# Patient Record
Sex: Female | Born: 2000 | Race: White | Hispanic: No | Marital: Single | Smoking: Never smoker
Health system: Southern US, Community
[De-identification: ages and names within clinical notes are randomized; demographics above are authoritative.]

## PROBLEM LIST (undated history)

## (undated) DIAGNOSIS — G35 Multiple sclerosis: Secondary | ICD-10-CM

## (undated) DIAGNOSIS — J45909 Unspecified asthma, uncomplicated: Secondary | ICD-10-CM

## (undated) HISTORY — DX: Unspecified asthma, uncomplicated: J45.909

## (undated) HISTORY — DX: Multiple sclerosis: G35

---
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
## 2020-07-20 DIAGNOSIS — R300 Dysuria: Secondary | ICD-10-CM | POA: Diagnosis not present

## 2020-12-03 DIAGNOSIS — U071 COVID-19: Secondary | ICD-10-CM | POA: Diagnosis not present

## 2020-12-26 DIAGNOSIS — Z0000 Encounter for general adult medical examination without abnormal findings: Secondary | ICD-10-CM | POA: Diagnosis not present

## 2020-12-26 DIAGNOSIS — E559 Vitamin D deficiency, unspecified: Secondary | ICD-10-CM | POA: Diagnosis not present

## 2021-02-18 DIAGNOSIS — F439 Reaction to severe stress, unspecified: Secondary | ICD-10-CM | POA: Diagnosis not present

## 2021-02-18 DIAGNOSIS — R002 Palpitations: Secondary | ICD-10-CM | POA: Diagnosis not present

## 2021-02-18 DIAGNOSIS — M549 Dorsalgia, unspecified: Secondary | ICD-10-CM | POA: Diagnosis not present

## 2021-02-18 DIAGNOSIS — Z6841 Body Mass Index (BMI) 40.0 and over, adult: Secondary | ICD-10-CM | POA: Diagnosis not present

## 2021-05-20 DIAGNOSIS — Z6841 Body Mass Index (BMI) 40.0 and over, adult: Secondary | ICD-10-CM | POA: Diagnosis not present

## 2021-05-20 DIAGNOSIS — R002 Palpitations: Secondary | ICD-10-CM | POA: Diagnosis not present

## 2021-05-20 DIAGNOSIS — M549 Dorsalgia, unspecified: Secondary | ICD-10-CM | POA: Diagnosis not present

## 2021-05-20 DIAGNOSIS — F439 Reaction to severe stress, unspecified: Secondary | ICD-10-CM | POA: Diagnosis not present

## 2021-08-20 DIAGNOSIS — R200 Anesthesia of skin: Secondary | ICD-10-CM | POA: Diagnosis not present

## 2021-08-20 DIAGNOSIS — Z6841 Body Mass Index (BMI) 40.0 and over, adult: Secondary | ICD-10-CM | POA: Diagnosis not present

## 2021-08-20 DIAGNOSIS — F439 Reaction to severe stress, unspecified: Secondary | ICD-10-CM | POA: Diagnosis not present

## 2021-10-07 DIAGNOSIS — M549 Dorsalgia, unspecified: Secondary | ICD-10-CM | POA: Diagnosis not present

## 2021-10-07 DIAGNOSIS — R202 Paresthesia of skin: Secondary | ICD-10-CM | POA: Diagnosis not present

## 2021-10-07 DIAGNOSIS — Z6837 Body mass index (BMI) 37.0-37.9, adult: Secondary | ICD-10-CM | POA: Diagnosis not present

## 2021-10-07 DIAGNOSIS — R200 Anesthesia of skin: Secondary | ICD-10-CM | POA: Diagnosis not present

## 2021-10-09 ENCOUNTER — Emergency Department (HOSPITAL_COMMUNITY): Payer: BC Managed Care – PPO

## 2021-10-09 ENCOUNTER — Encounter (HOSPITAL_COMMUNITY): Payer: Self-pay

## 2021-10-09 ENCOUNTER — Inpatient Hospital Stay (HOSPITAL_COMMUNITY)
Admission: EM | Admit: 2021-10-09 | Discharge: 2021-10-14 | DRG: 059 | Disposition: A | Discharge status: Home / Self Care | Payer: BC Managed Care – PPO | Attending: Infectious Diseases | Admitting: Infectious Diseases

## 2021-10-09 ENCOUNTER — Other Ambulatory Visit: Payer: Self-pay

## 2021-10-09 DIAGNOSIS — R4182 Altered mental status, unspecified: Secondary | ICD-10-CM | POA: Diagnosis not present

## 2021-10-09 DIAGNOSIS — Z6841 Body Mass Index (BMI) 40.0 and over, adult: Secondary | ICD-10-CM

## 2021-10-09 DIAGNOSIS — F419 Anxiety disorder, unspecified: Secondary | ICD-10-CM | POA: Diagnosis not present

## 2021-10-09 DIAGNOSIS — G35 Multiple sclerosis: Secondary | ICD-10-CM | POA: Diagnosis not present

## 2021-10-09 DIAGNOSIS — R531 Weakness: Secondary | ICD-10-CM

## 2021-10-09 DIAGNOSIS — R200 Anesthesia of skin: Secondary | ICD-10-CM | POA: Diagnosis not present

## 2021-10-09 DIAGNOSIS — I959 Hypotension, unspecified: Secondary | ICD-10-CM | POA: Diagnosis present

## 2021-10-09 DIAGNOSIS — M50221 Other cervical disc displacement at C4-C5 level: Secondary | ICD-10-CM | POA: Diagnosis not present

## 2021-10-09 DIAGNOSIS — T380X5A Adverse effect of glucocorticoids and synthetic analogues, initial encounter: Secondary | ICD-10-CM | POA: Diagnosis present

## 2021-10-09 DIAGNOSIS — F32A Depression, unspecified: Secondary | ICD-10-CM | POA: Diagnosis present

## 2021-10-09 DIAGNOSIS — R739 Hyperglycemia, unspecified: Secondary | ICD-10-CM | POA: Diagnosis not present

## 2021-10-09 DIAGNOSIS — Z881 Allergy status to other antibiotic agents status: Secondary | ICD-10-CM | POA: Diagnosis not present

## 2021-10-09 DIAGNOSIS — R000 Tachycardia, unspecified: Secondary | ICD-10-CM | POA: Diagnosis not present

## 2021-10-09 DIAGNOSIS — M439 Deforming dorsopathy, unspecified: Secondary | ICD-10-CM | POA: Diagnosis not present

## 2021-10-09 DIAGNOSIS — M545 Low back pain, unspecified: Secondary | ICD-10-CM | POA: Diagnosis not present

## 2021-10-09 DIAGNOSIS — R270 Ataxia, unspecified: Secondary | ICD-10-CM | POA: Diagnosis not present

## 2021-10-09 DIAGNOSIS — R519 Headache, unspecified: Secondary | ICD-10-CM | POA: Diagnosis not present

## 2021-10-09 DIAGNOSIS — Z880 Allergy status to penicillin: Secondary | ICD-10-CM

## 2021-10-09 LAB — RAPID URINE DRUG SCREEN, HOSP PERFORMED
Amphetamines: NOT DETECTED
Barbiturates: NOT DETECTED
Benzodiazepines: NOT DETECTED
Cocaine: NOT DETECTED
Opiates: NOT DETECTED
Tetrahydrocannabinol: NOT DETECTED

## 2021-10-09 LAB — COMPREHENSIVE METABOLIC PANEL
ALT: 13 U/L (ref 0–44)
AST: 15 U/L (ref 15–41)
Albumin: 3.5 g/dL (ref 3.5–5.0)
Alkaline Phosphatase: 99 U/L (ref 38–126)
Anion gap: 8 (ref 5–15)
BUN: 12 mg/dL (ref 6–20)
CO2: 25 mmol/L (ref 22–32)
Calcium: 8.8 mg/dL — ABNORMAL LOW (ref 8.9–10.3)
Chloride: 105 mmol/L (ref 98–111)
Creatinine, Ser: 0.88 mg/dL (ref 0.44–1.00)
GFR, Estimated: >60 mL/min (ref >60)
Glucose, Bld: 94 mg/dL (ref 70–99)
Potassium: 3.9 mmol/L (ref 3.5–5.1)
Sodium: 138 mmol/L (ref 135–145)
Total Bilirubin: 0.5 mg/dL (ref 0.3–1.2)
Total Protein: 7.7 g/dL (ref 6.5–8.1)

## 2021-10-09 LAB — URINALYSIS, ROUTINE W REFLEX MICROSCOPIC
Bilirubin Urine: NEGATIVE
Glucose, UA: NEGATIVE mg/dL
Ketones, ur: NEGATIVE mg/dL
Leukocytes,Ua: NEGATIVE
Nitrite: NEGATIVE
Protein, ur: NEGATIVE mg/dL
Specific Gravity, Urine: 1.019 (ref 1.005–1.030)
pH: 5 (ref 5.0–8.0)

## 2021-10-09 LAB — CBC WITH DIFFERENTIAL/PLATELET
Abs Immature Granulocytes: 0.01 10*3/uL (ref 0.00–0.07)
Basophils Absolute: 0.1 10*3/uL (ref 0.0–0.1)
Basophils Relative: 1 %
Eosinophils Absolute: 0.1 10*3/uL (ref 0.0–0.5)
Eosinophils Relative: 1 %
HCT: 40.6 % (ref 36.0–46.0)
Hemoglobin: 13.1 g/dL (ref 12.0–15.0)
Immature Granulocytes: 0 %
Lymphocytes Relative: 17 %
Lymphs Abs: 1.6 10*3/uL (ref 0.7–4.0)
MCH: 28.5 pg (ref 26.0–34.0)
MCHC: 32.3 g/dL (ref 30.0–36.0)
MCV: 88.5 fL (ref 80.0–100.0)
Monocytes Absolute: 0.5 10*3/uL (ref 0.1–1.0)
Monocytes Relative: 6 %
Neutro Abs: 7.1 10*3/uL (ref 1.7–7.7)
Neutrophils Relative %: 75 %
Platelets: 432 10*3/uL — ABNORMAL HIGH (ref 150–400)
RBC: 4.59 MIL/uL (ref 3.87–5.11)
RDW: 13.8 % (ref 11.5–15.5)
WBC: 9.5 10*3/uL (ref 4.0–10.5)
nRBC: 0 % (ref 0.0–0.2)

## 2021-10-09 LAB — I-STAT BETA HCG BLOOD, ED (MC, WL, AP ONLY): I-stat hCG, quantitative: <5 m[IU]/mL (ref <5)

## 2021-10-09 LAB — AMMONIA: Ammonia: 11 umol/L (ref 9–35)

## 2021-10-09 LAB — TSH: TSH: 2.722 u[IU]/mL (ref 0.350–4.500)

## 2021-10-09 IMAGING — MR MR HEAD WO/W CM
6 of 12 series · 26 of 48 positions shown · IV contrast (gadavist)
Comparison: No prior MRI, correlation is made with CT head
[DATE]

CLINICAL DATA: Pressure in her spine and numbness in her
extremities, mental status change, stroke suspected

EXAM:
MRI HEAD WITHOUT AND WITH CONTRAST
TECHNIQUE: Multiplanar, multiecho pulse sequences of the brain and surrounding
structures were obtained without and with intravenous contrast.
CONTRAST:  10mL GADAVIST GADOBUTROL 1 MMOL/ML IV SOLN

[Series 2: DWI · axial · 3.0mm · 0.94mm/px · z∈[-84,+62]mm · 8 of 100 slices shown (1 of 2)]
[im 1/100]
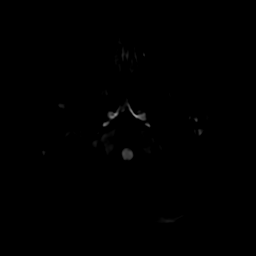
[im 15/100]
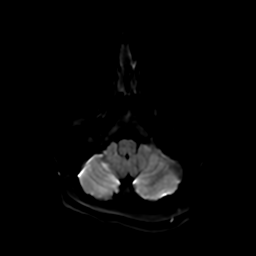
[im 29/100]
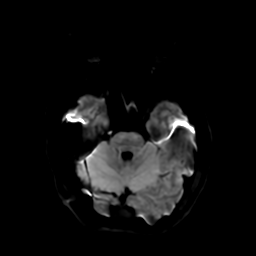
[im 43/100]
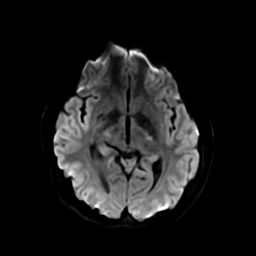
[im 57/100]
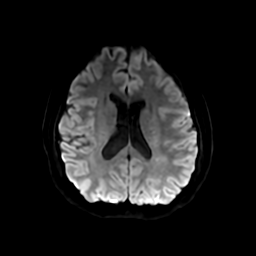
[im 71/100]
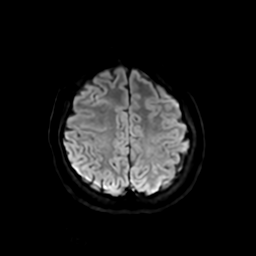
[im 85/100]
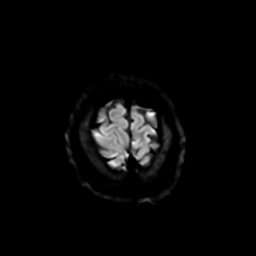
[im 100/100]
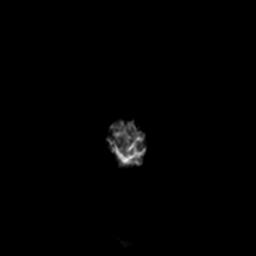

[Series 3: DWI · coronal · 4.0mm · 0.94mm/px · 6 of 74 slices shown (2 of 2)]
[im 1/74]
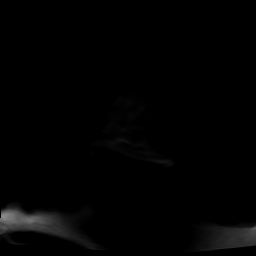
[im 15/74]
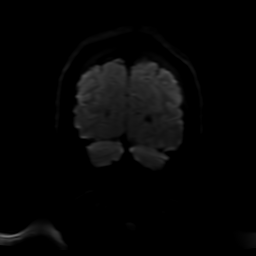
[im 30/74]
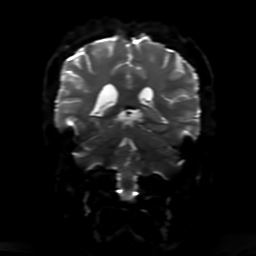
[im 44/74]
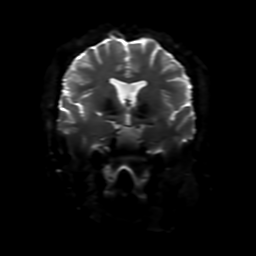
[im 59/74]
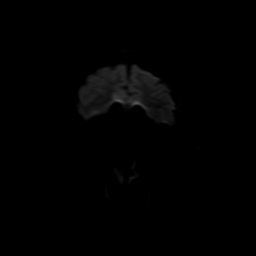
[im 74/74]
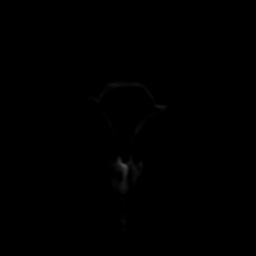

[Series 4: FLAIR · sagittal · 5.0mm · 0.23mm/px · 2 of 26 slices shown (1 of 2)]
[im 1/26]
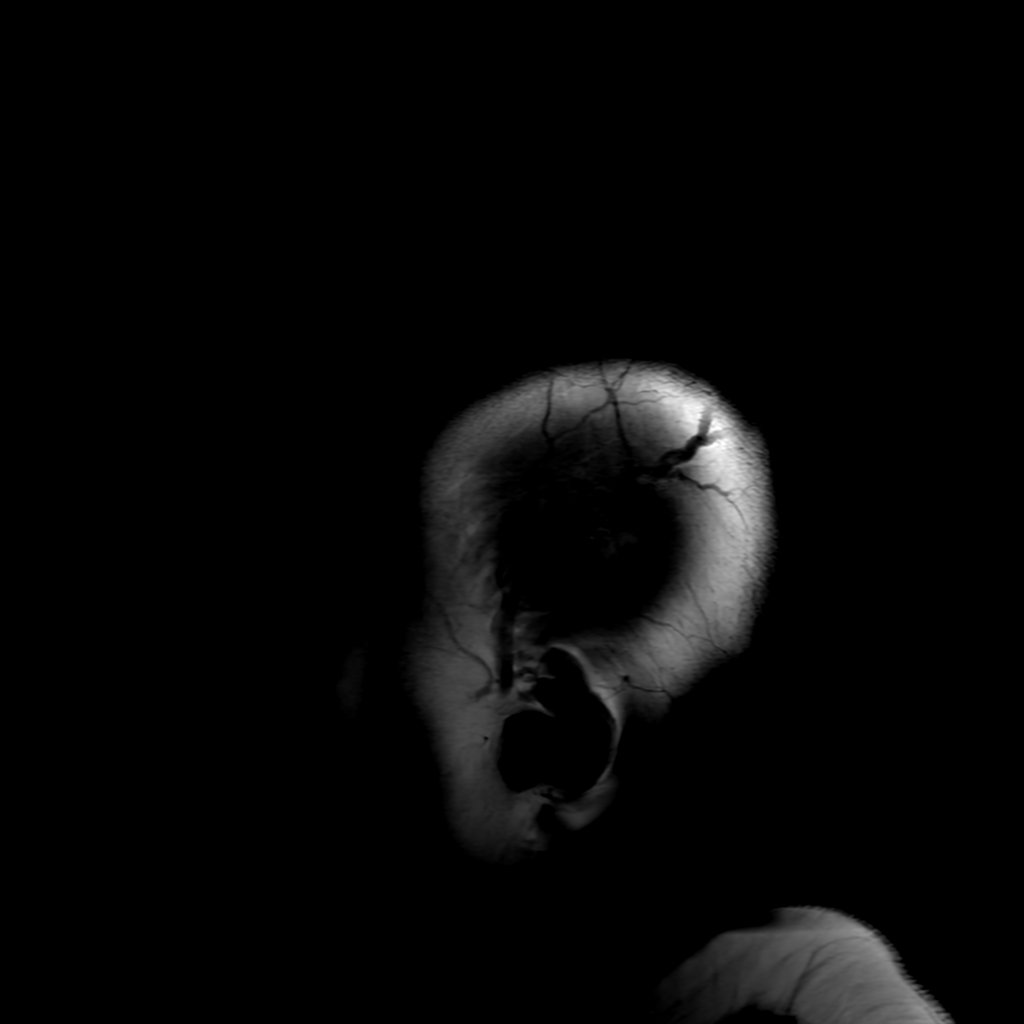
[im 26/26]
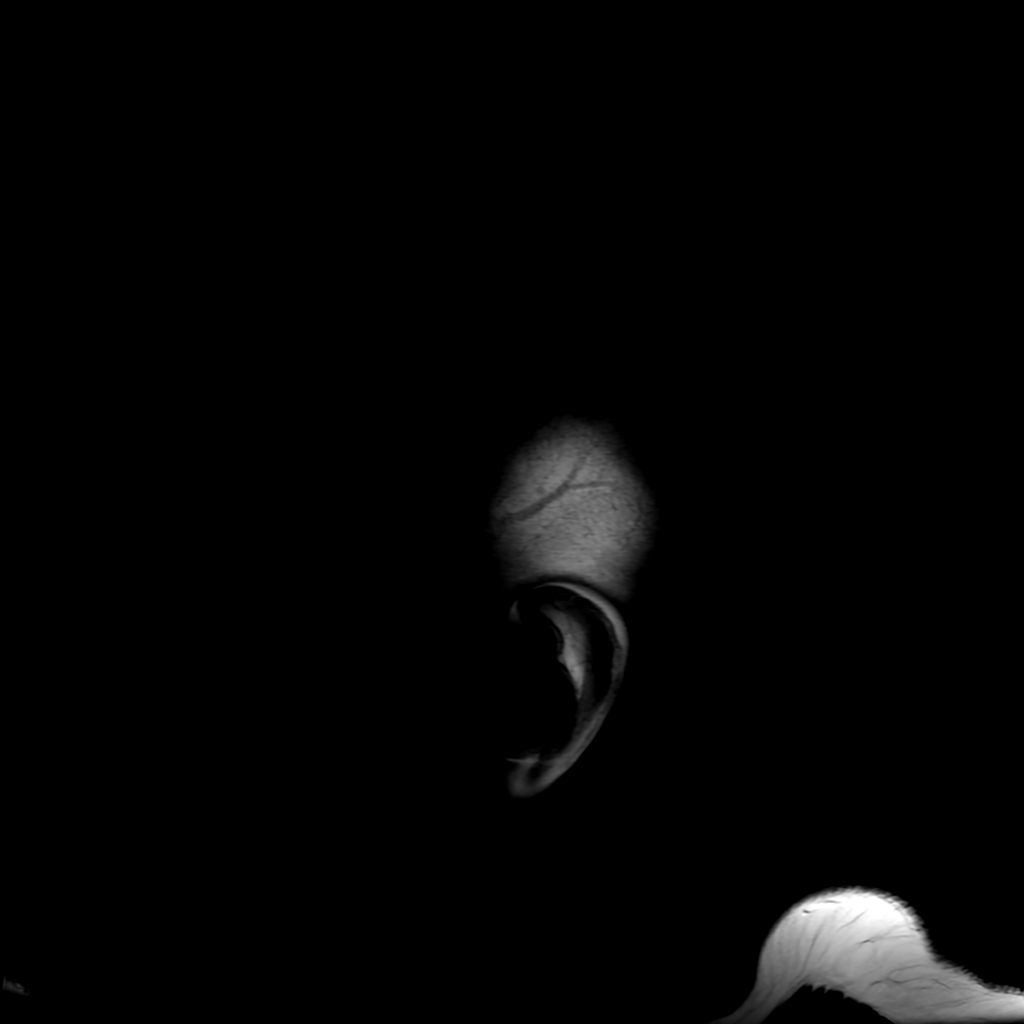

[Series 6: FLAIR · axial · 4.0mm · 0.45mm/px · z∈[-86,+62]mm · 3 of 35 slices shown (2 of 2)]
[im 1/35]
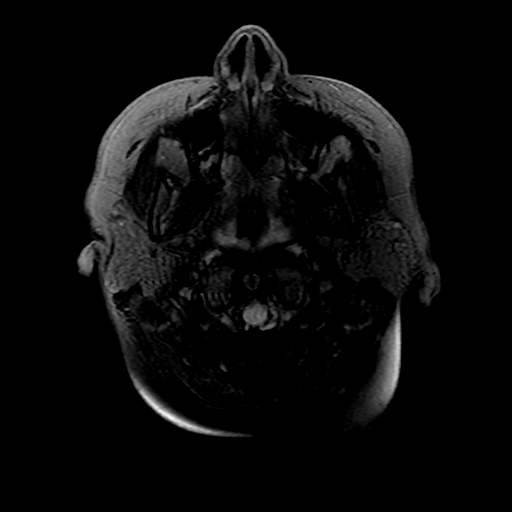
[im 18/35]
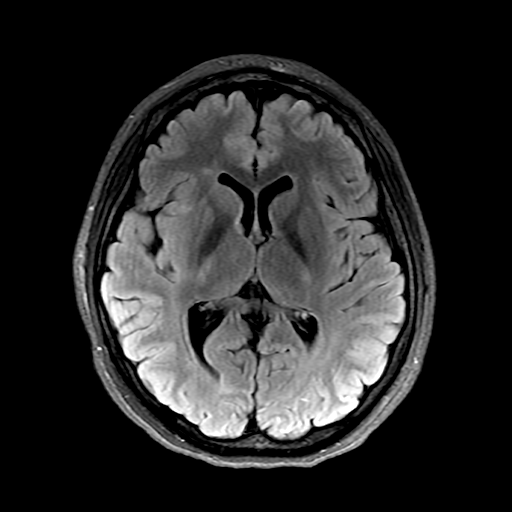
[im 35/35]
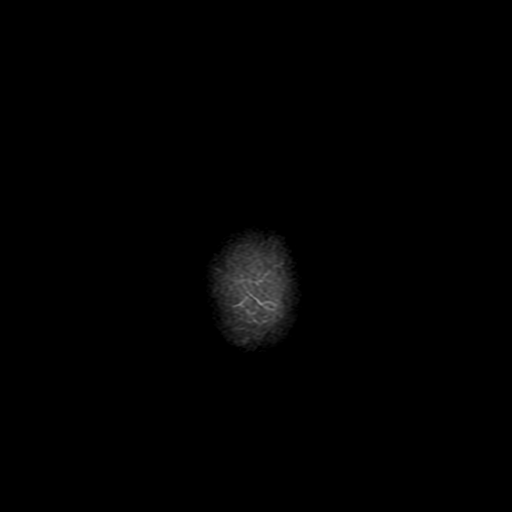

[Series 250: ADC · axial · 3.0mm · 0.94mm/px · z∈[-84,+62]mm · 4 of 50 slices shown (1 of 2)]
[im 1/50]
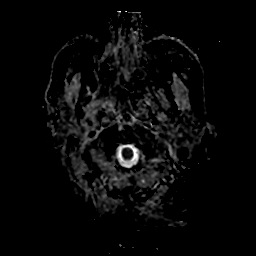
[im 17/50]
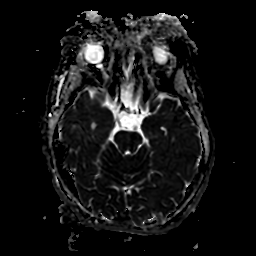
[im 33/50]
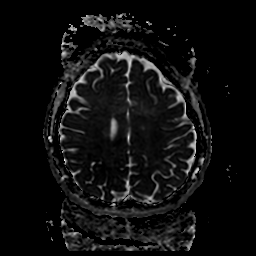
[im 50/50]
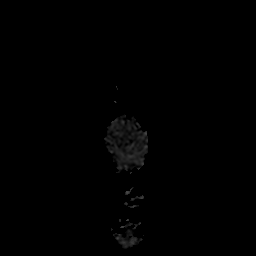

[Series 350: ADC · coronal · 4.0mm · 0.94mm/px · 3 of 37 slices shown (2 of 2)]
[im 1/37]
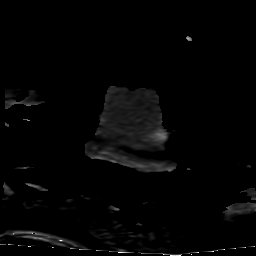
[im 19/37]
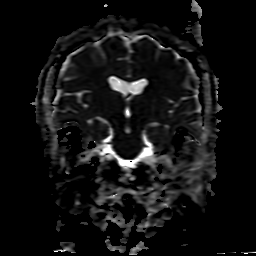
[im 37/37]
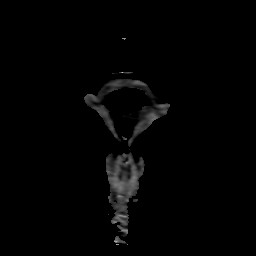

[26 of 48 positions shown; findings below may reference images not displayed]

FINDINGS: Brain: No restricted diffusion to suggest acute or subacute infarct.
No acute hemorrhage, mass, mass effect, or midline shift. No
hemosiderin deposition to suggest remote hemorrhage. No
hydrocephalus or extra-axial collection. Punctate focus of possible
contrast enhancement in the pons is not associated with any other
abnormal signal and is favored to represent a small capillary
telangiectasia. No other abnormal parenchymal or meningeal
enhancement.

Several ovoid, radially oriented, T2 hyperintense foci are noted in
the periventricular white matter (series 6, image 21 and 24, for
example).

Vascular: Normal arterial flow voids.

Skull and upper cervical spine: Normal marrow signal. No abnormal
enhancement.

Sinuses/Orbits: No acute finding.

Other: The mastoids are well aerated.
IMPRESSION: 1. Radially oriented T2 hyperintense foci in the periventricular
white matter, which are concerning for multiple sclerosis. No
enhancing lesions to suggest active demyelination.
2. No additional acute intracranial process. No evidence of acute or
subacute infarct.

## 2021-10-09 IMAGING — MR MR CERVICAL SPINE WO/W CM
5 of 8 series · 18 of 48 positions shown · IV contrast (gadavist)
Comparison: None Available.

CLINICAL DATA: Ataxia, stroke suspected

EXAM:
MRI CERVICAL AND THORACIC SPINE WITHOUT AND WITH CONTRAST
TECHNIQUE: Multiplanar and multiecho pulse sequences of the cervical spine, to
include the craniocervical junction and cervicothoracic junction,
and the thoracic spine, were obtained without and with intravenous
contrast.
CONTRAST:  10mL GADAVIST GADOBUTROL 1 MMOL/ML IV SOLN

[Series 10: T2 · sagittal · 3.0mm · 0.43mm/px · 2 of 16 slices shown (1 of 2)]
[im 1/16]
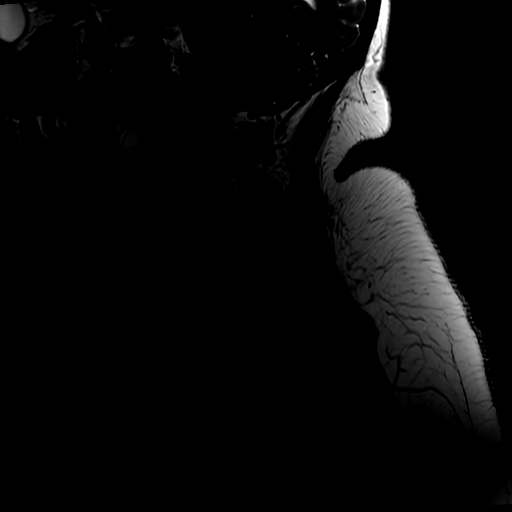
[im 16/16]
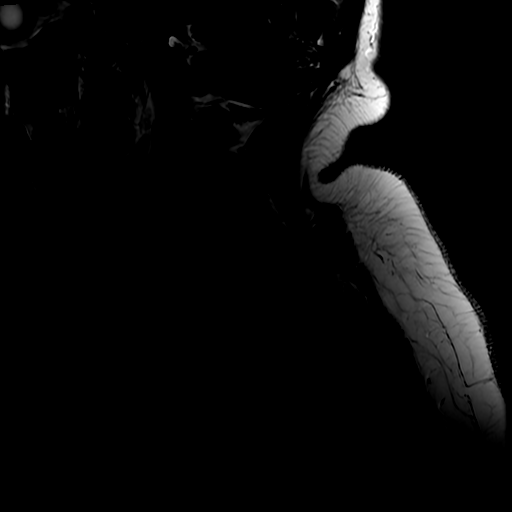

[Series 12: STIR · sagittal · 3.0mm · 0.43mm/px · 1 of 17 slices shown]
[im 1/17]
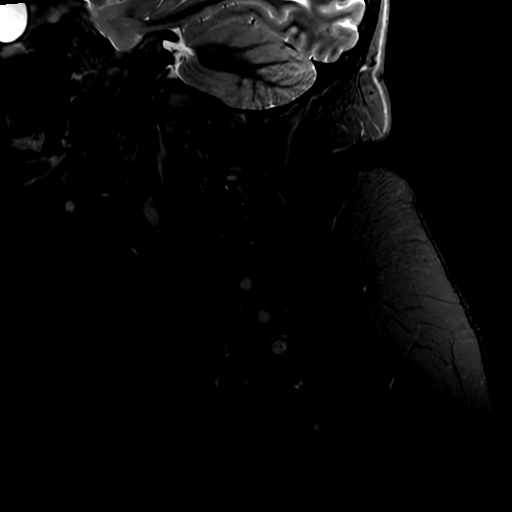

[Series 13: T2 · axial · 3.0mm · 0.35mm/px · z∈[-243,-150]mm · 6 of 30 slices shown (2 of 2)]
[im 1/30]
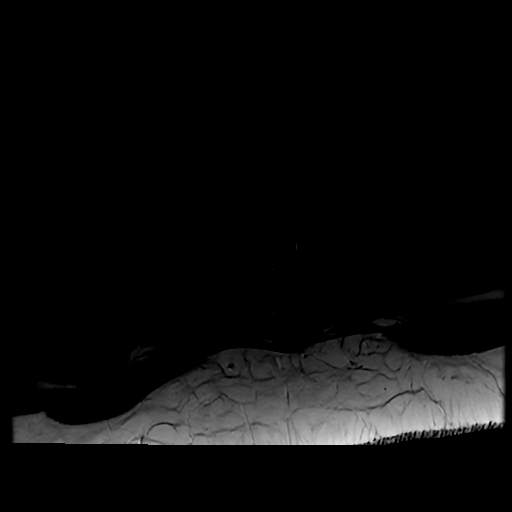
[im 6/30]
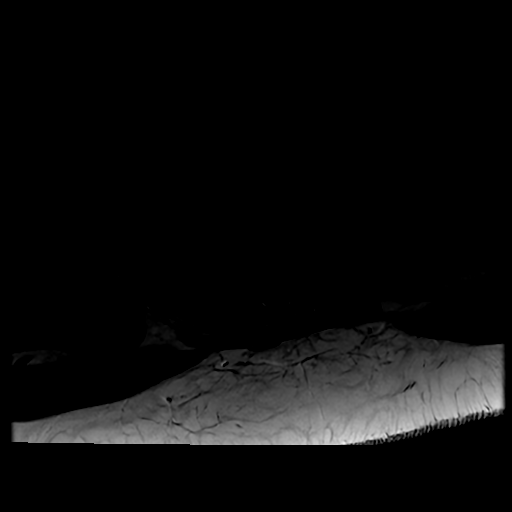
[im 12/30]
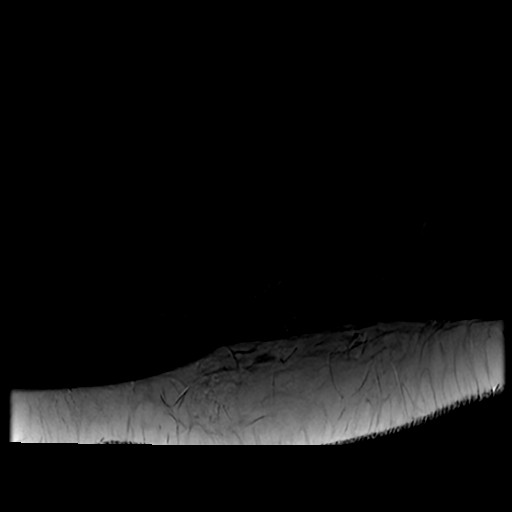
[im 18/30]
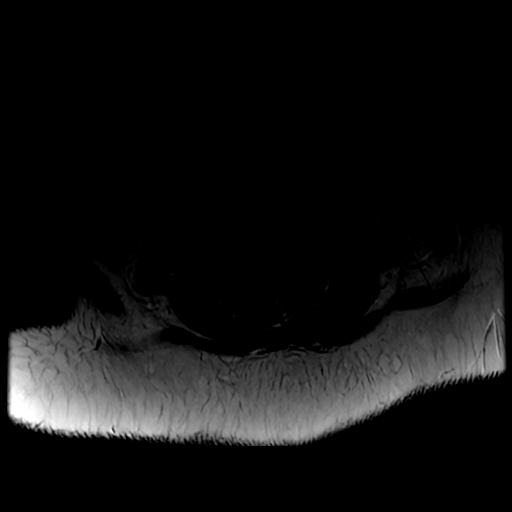
[im 24/30]
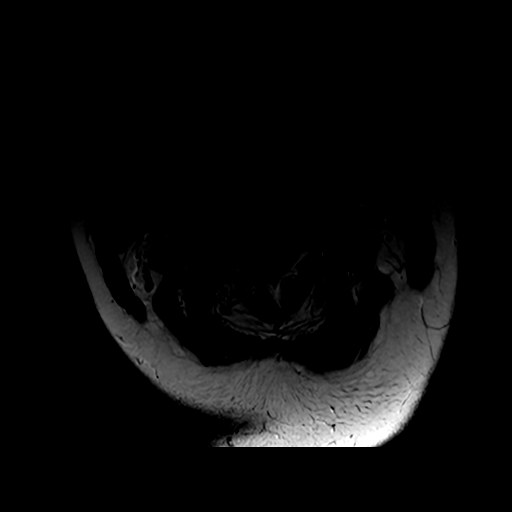
[im 30/30]
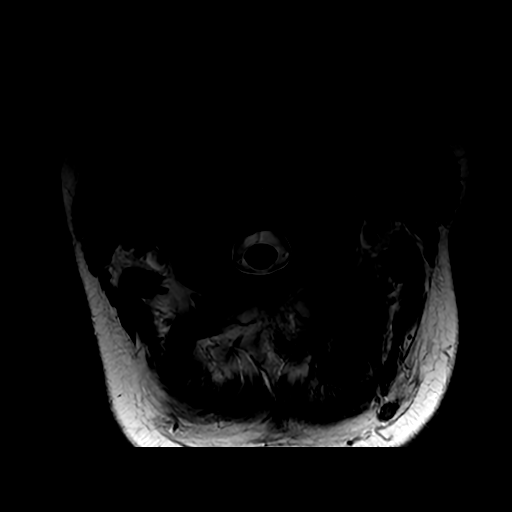

[Series 15: T1 · axial · non-contrast · 3.0mm · 0.35mm/px · z∈[-243,-150]mm · 6 of 30 slices shown]
[im 1/30]
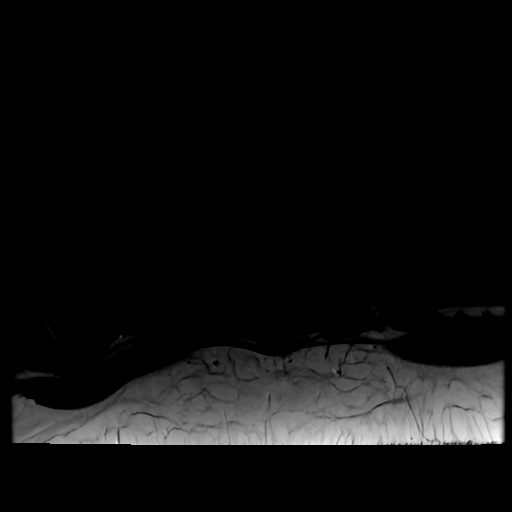
[im 6/30]
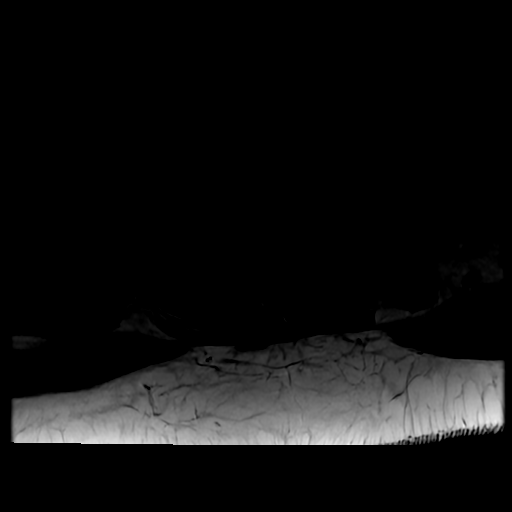
[im 12/30]
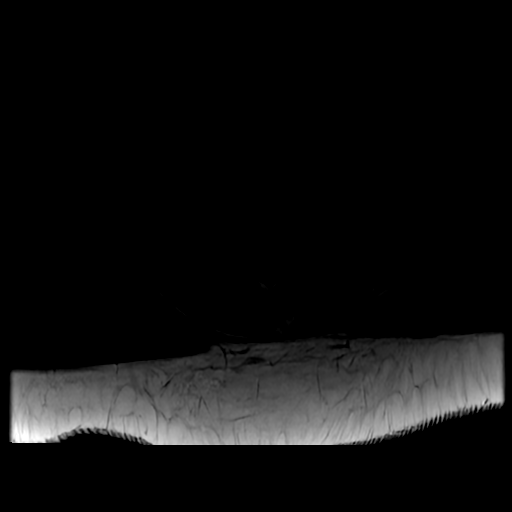
[im 18/30]
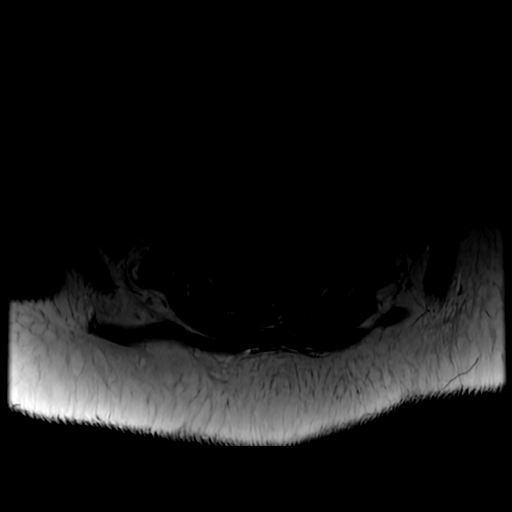
[im 24/30]
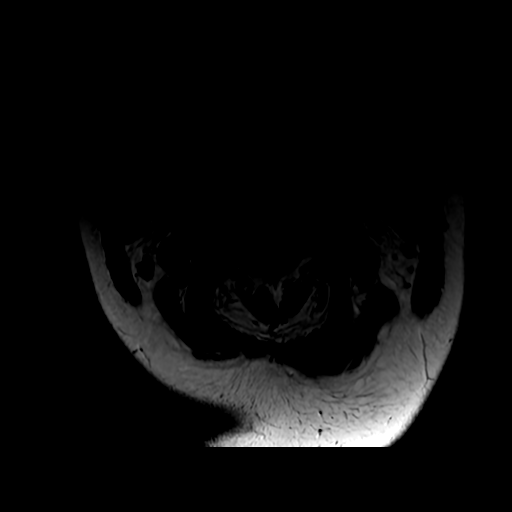
[im 30/30]
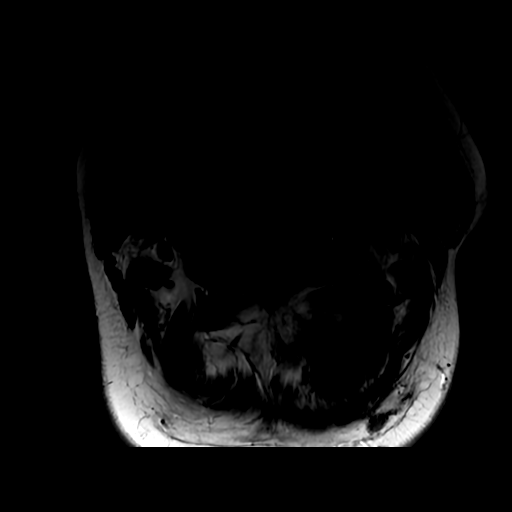

[Series 27: T1 fat-sat post-contrast · sagittal · 3.0mm · 0.43mm/px · 3 of 16 slices shown]
[im 1/16]
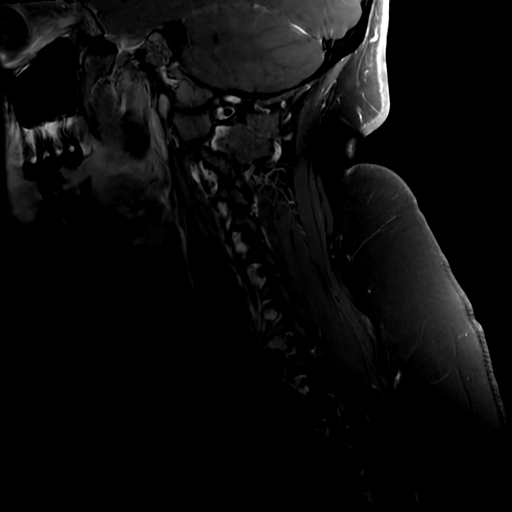
[im 8/16]
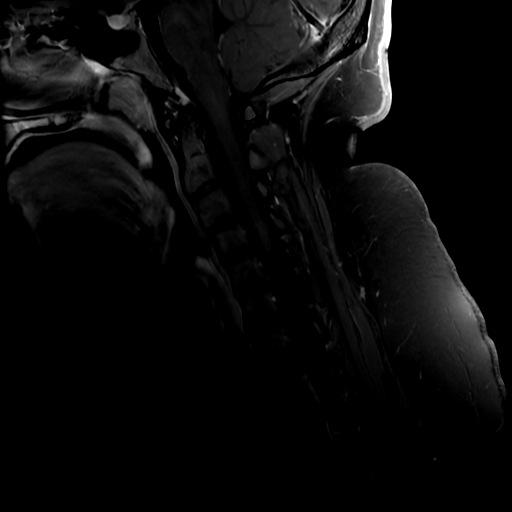
[im 16/16]
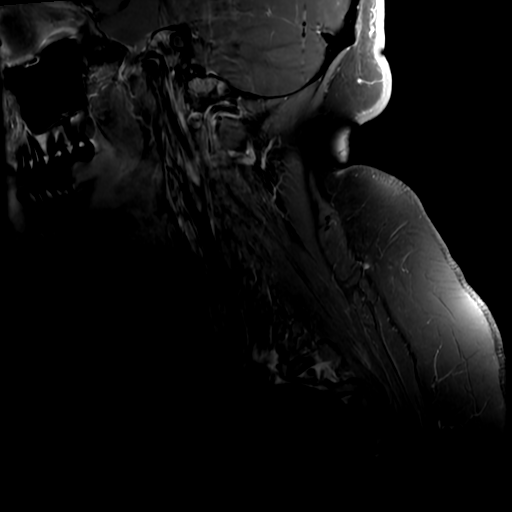

[18 of 48 positions shown; findings below may reference images not displayed]

FINDINGS: MRI CERVICAL SPINE FINDINGS

Alignment: Straightening of the normal cervical lordosis, which may
be positional. No listhesis.

Vertebrae: No acute fracture or suspicious osseous lesion. No
abnormal enhancement.

Cord: Mildly increased T2 signal in the right-greater-than-left
aspect of the spinal cord, extending from inferior aspect of C2
through the superior aspect of C5 (series 10, image 9), with some
areas that are near holocord involvement (series 13, image 10).
Additional more focal and more intense areas of T2 hyperintense
signal in the dorsal left paracentral cord at C4 (series 13, image
11), although this is only seen on a single slice. Possible
additional focus in the dorsal cord at C6 (series 13, images
23-25)(series 13, image 23). No abnormal cord enhancement.

Posterior Fossa, vertebral arteries, paraspinal tissues: Negative.

Disc levels:

C4-C5: Small left paracentral disc protrusion. No spinal canal
stenosis or neural foraminal narrowing.

Small central disc protrusion. No spinal canal stenosis or neural
foraminal narrowing.

MRI THORACIC SPINE FINDINGS

Alignment: Mild S shaped curvature of the thoracolumbar spine. No
listhesis.

Vertebrae: Acute osseous abnormality.  No abnormal enhancement.

Cord: Evaluation is somewhat limited by motion artifact. Possible
area of mildly increased T2 signal in the central cord that extends
over several slices at T3-T4 (series 21, images 12-15 and series 18,
image 10), although this may be artifactual. No abnormal
enhancement.

Paraspinal and other soft tissues: Negative.

Disc levels:

No spinal canal stenosis or neural foraminal narrowing.
IMPRESSION: 1. Areas of increased T2 signal in the cervical spinal cord, with 1
possible cervical lesion extending approximately 2-3 vertebral
bodies in craniocaudal dimension. Additional smaller T2 hyperintense
foci in the spinal cord at C4 and C6 are also seen. These remain
concerning for multiple sclerosis. No abnormal enhancement to
suggest active demyelination.
2. Evaluation of the thoracic spinal cord is somewhat limited by
motion artifact. There is possible T2 hyperintense lesion at T3-T4,
although this may be artifactual. No abnormal enhancement to suggest
active demyelination.
3. No spinal canal stenosis or neural foraminal narrowing.

## 2021-10-09 IMAGING — CT CT HEAD W/O CM
4 series · 16 of 47 positions shown, 18 images · non-contrast
Comparison: None Available.

CLINICAL DATA: Headache numbness



[Series 3: head wo · axial · 0.41mm/px · z∈[-164,-44]mm · 7 of 32 slices shown, 9 images]
[im 4/32  brain]
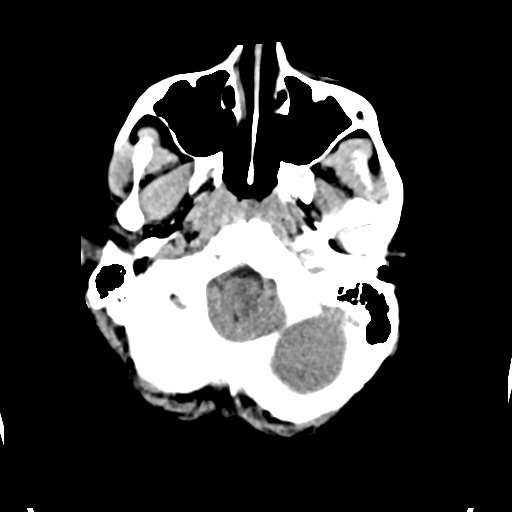
[im 4/32  bone]
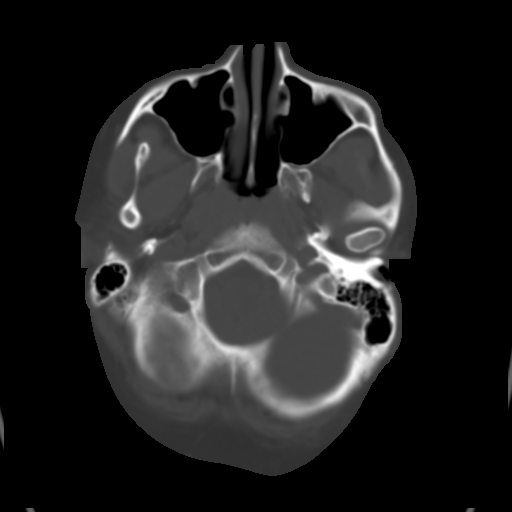
[im 8/32  brain]
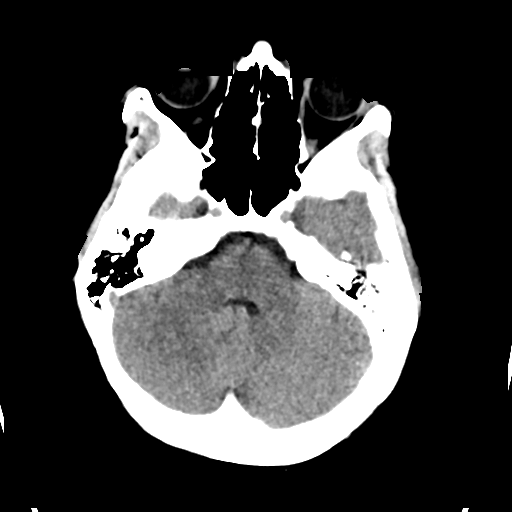
[im 12/32  brain]
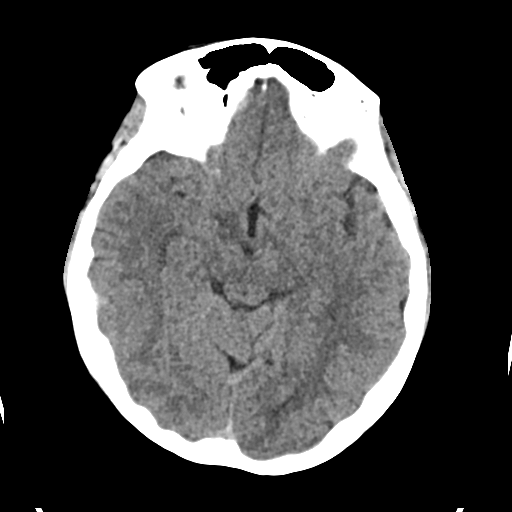
[im 16/32  brain]
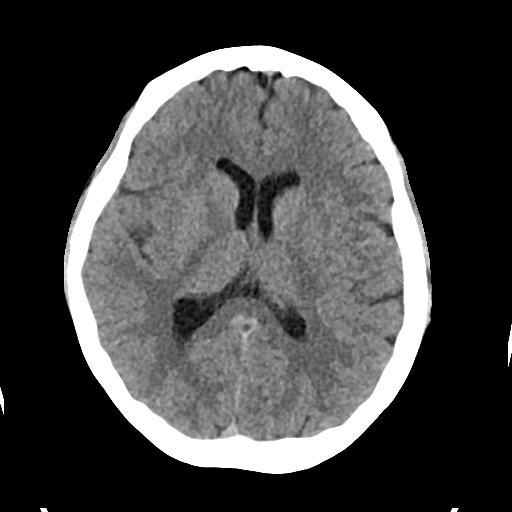
[im 20/32  brain]
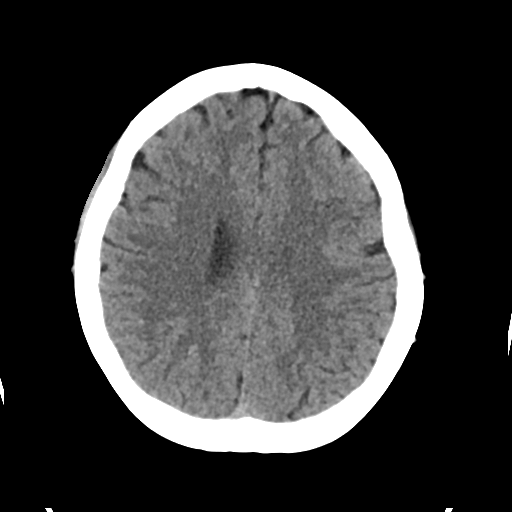
[im 20/32  bone]
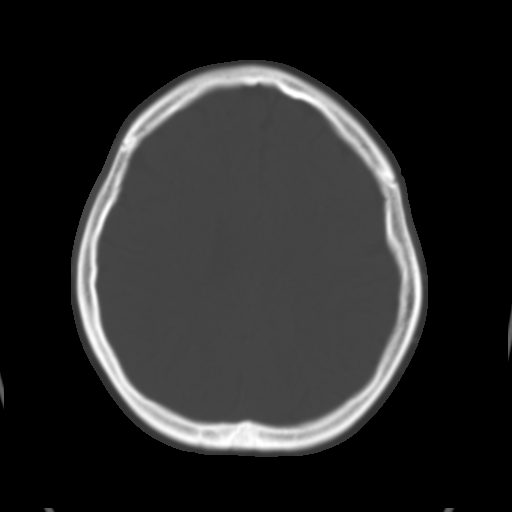
[im 24/32  brain]
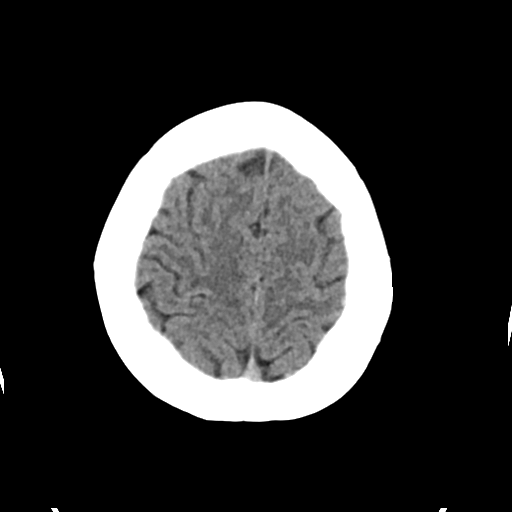
[im 28/32  brain]
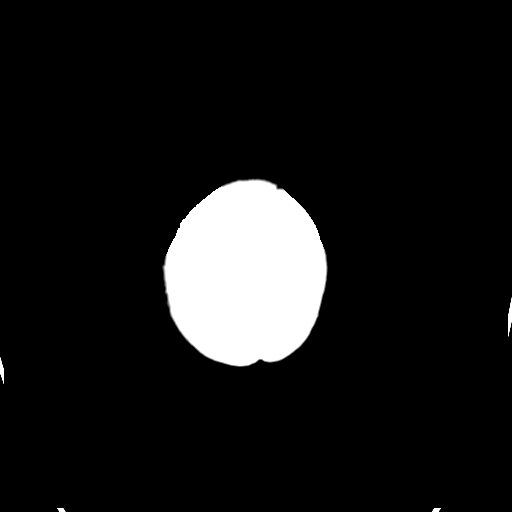

[Series 4: head bone · axial · 0.41mm/px · z∈[-164,-132]mm · 3 of 78 slices shown]
[im 8/78  bone]
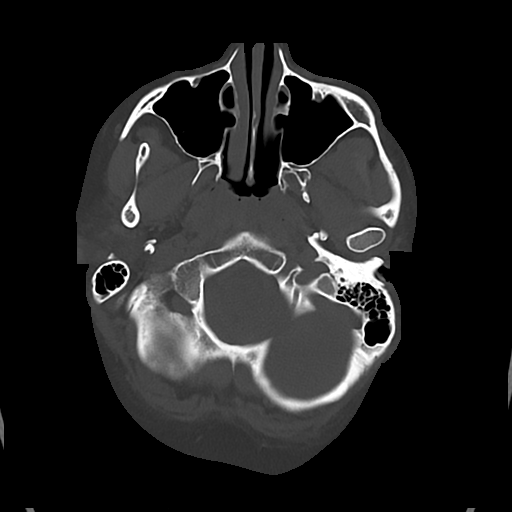
[im 16/78  bone]
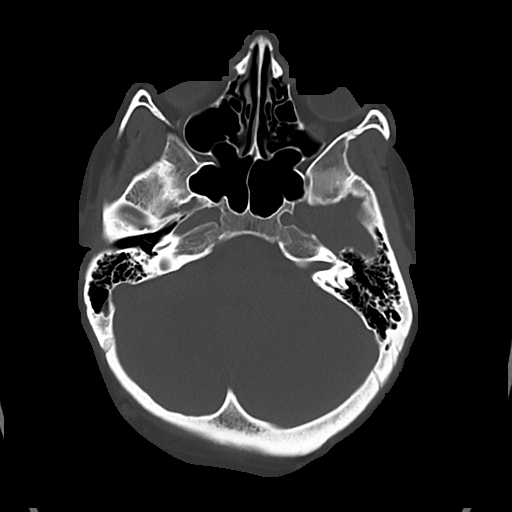
[im 24/78  bone]
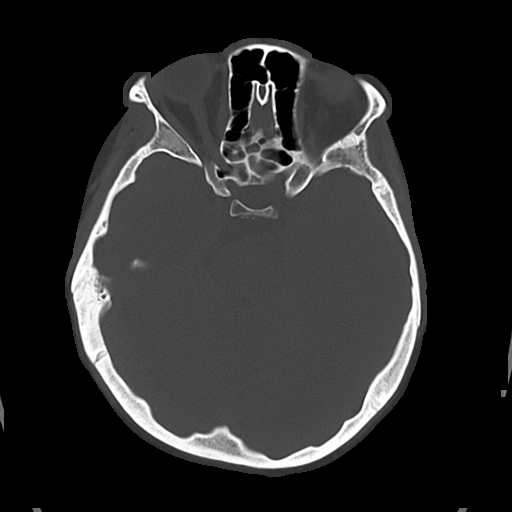

[Series 5: cor soft · coronal · 0.33mm/px · 3 of 68 slices shown]
[im 23/68  brain]
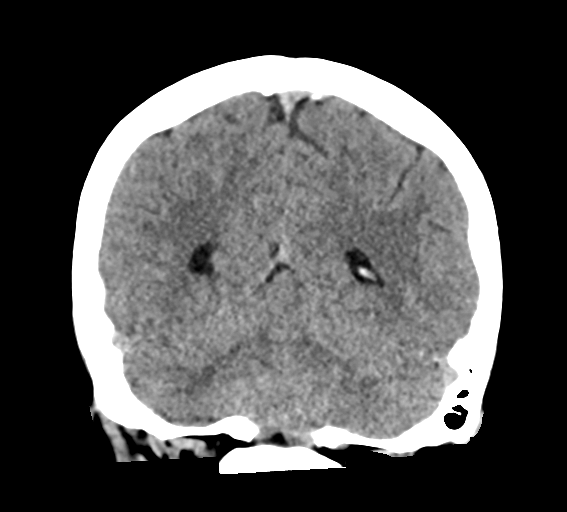
[im 30/68  brain]
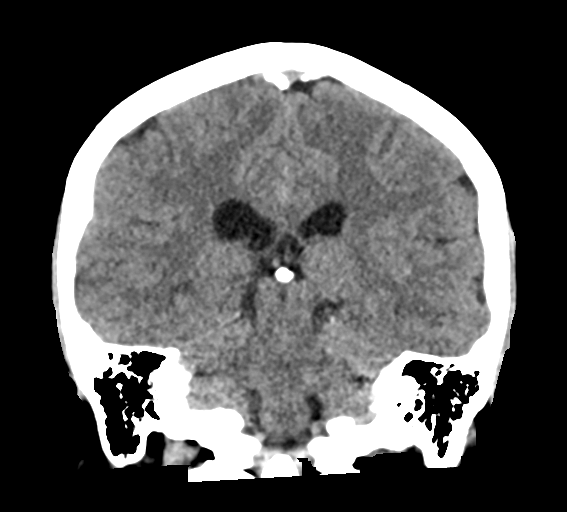
[im 38/68  brain]
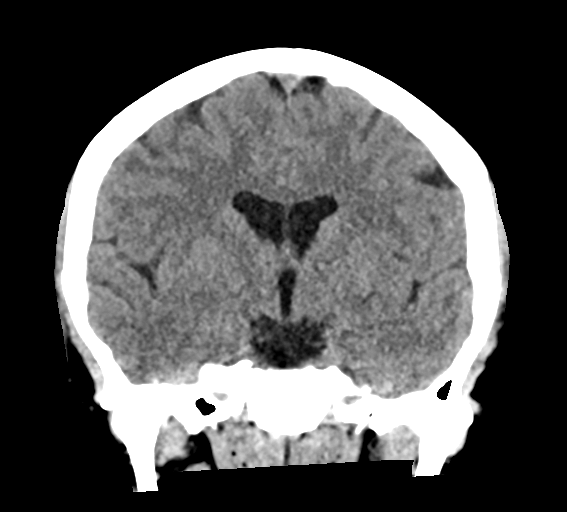

[Series 6: sag soft · sagittal · 0.32mm/px · 3 of 61 slices shown]
[im 21/61  brain]
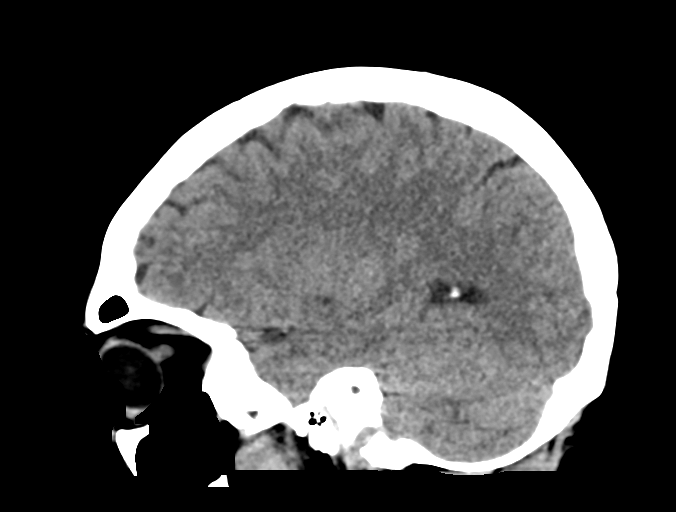
[im 31/61  brain]
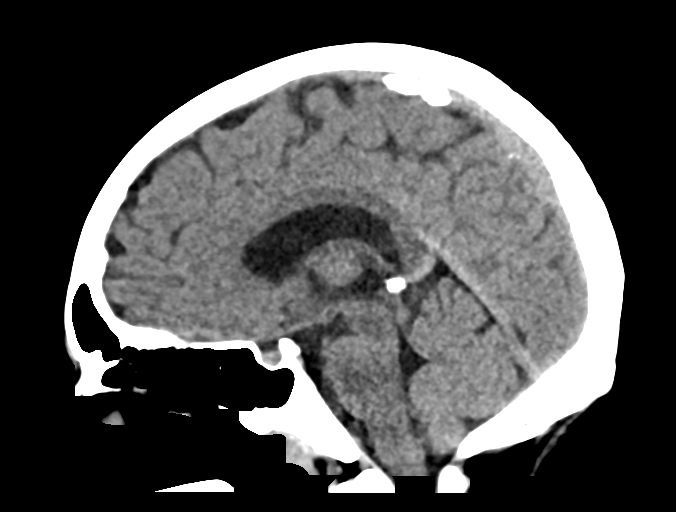
[im 41/61  brain]
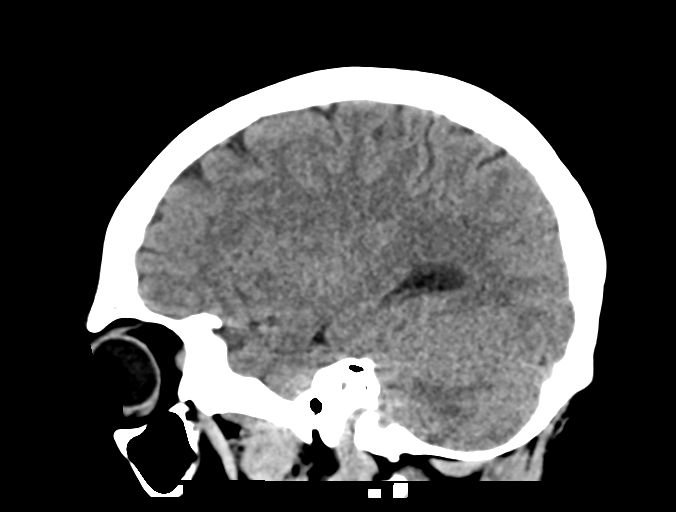

[16 of 47 positions shown; findings below may reference images not displayed]

FINDINGS: Brain: No evidence of acute infarction, hemorrhage, hydrocephalus,
extra-axial collection or mass lesion/mass effect.

Vascular: No hyperdense vessel or unexpected calcification.

Skull: Normal. Negative for fracture or focal lesion.

Sinuses/Orbits: No acute finding.

Other: None
IMPRESSION: Negative non contrasted CT appearance of the brain

## 2021-10-09 IMAGING — MR MR THORACIC SPINE WO/W CM
5 of 10 series · 18 of 48 positions shown · IV contrast (gadavist)
Comparison: None Available.

CLINICAL DATA: Ataxia, stroke suspected

EXAM:
MRI CERVICAL AND THORACIC SPINE WITHOUT AND WITH CONTRAST
TECHNIQUE: Multiplanar and multiecho pulse sequences of the cervical spine, to
include the craniocervical junction and cervicothoracic junction,
and the thoracic spine, were obtained without and with intravenous
contrast.
CONTRAST:  10mL GADAVIST GADOBUTROL 1 MMOL/ML IV SOLN

[Series 17: T1 · sagittal · 3.0mm · 0.90mm/px · 3 of 13 slices shown (1 of 3)]
[im 1/13]
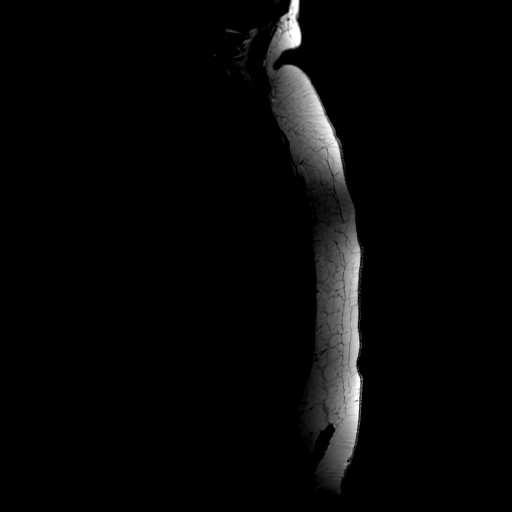
[im 7/13]
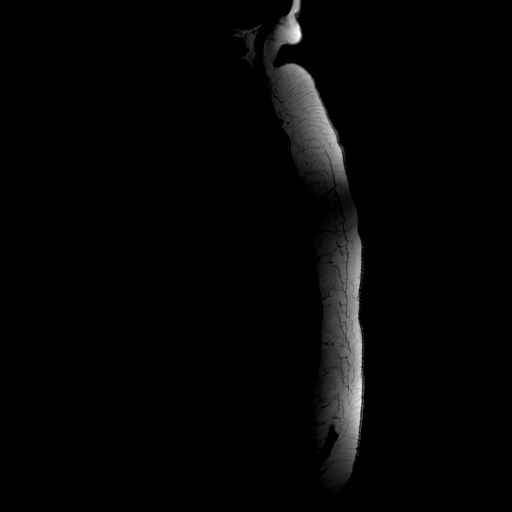
[im 13/13]
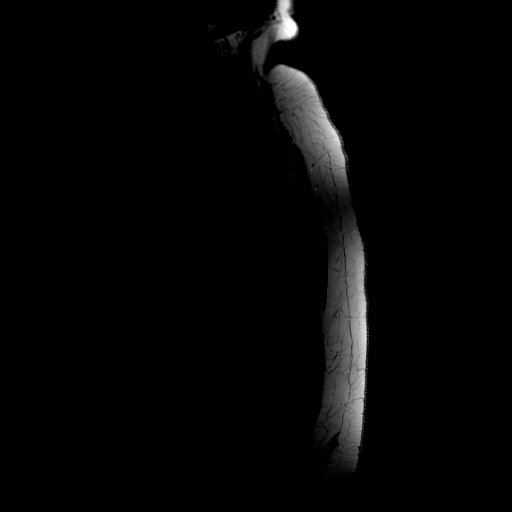

[Series 18: T2 · sagittal · 3.0mm · 0.66mm/px · 2 of 17 slices shown (1 of 2)]
[im 1/17]
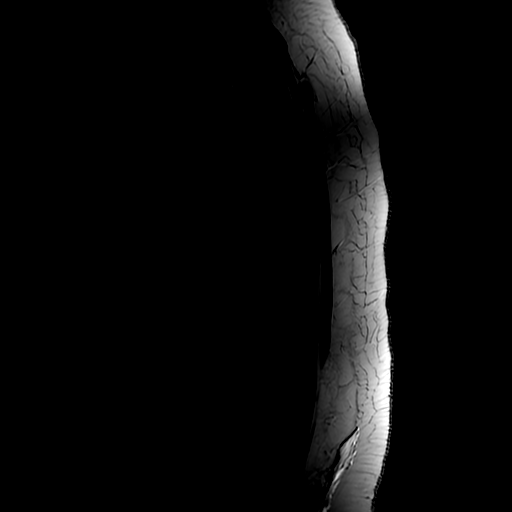
[im 17/17]
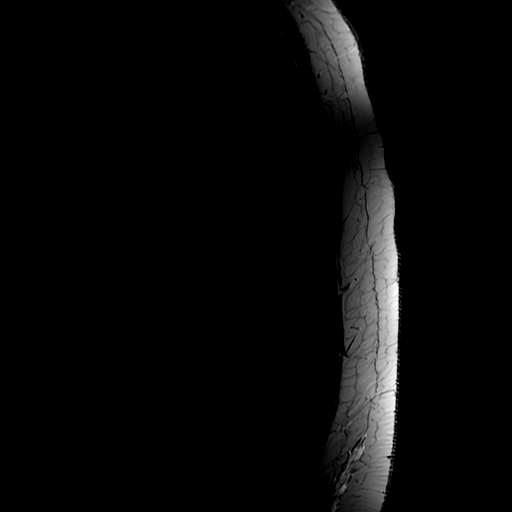

[Series 20: T1 · sagittal · 3.0mm · 0.66mm/px · 2 of 17 slices shown (2 of 3)]
[im 1/17]
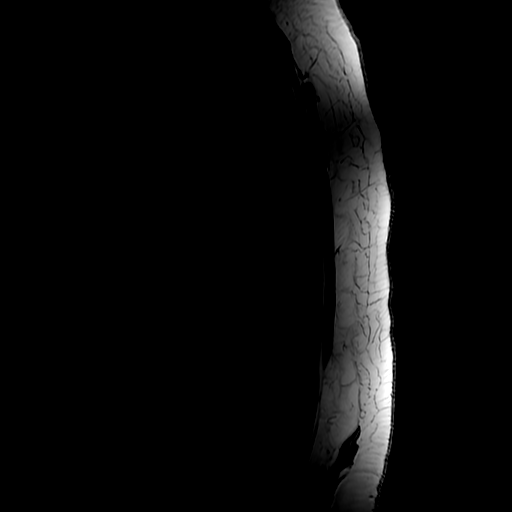
[im 17/17]
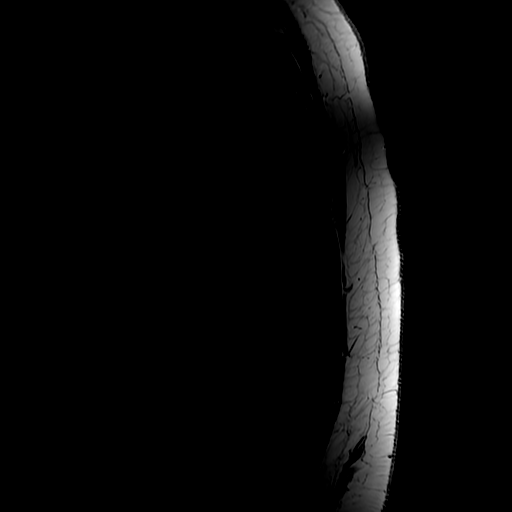

[Series 21: T2 · axial · 4.0mm · 0.39mm/px · z∈[-479,-212]mm · 8 of 55 slices shown (2 of 2)]
[im 1/55]
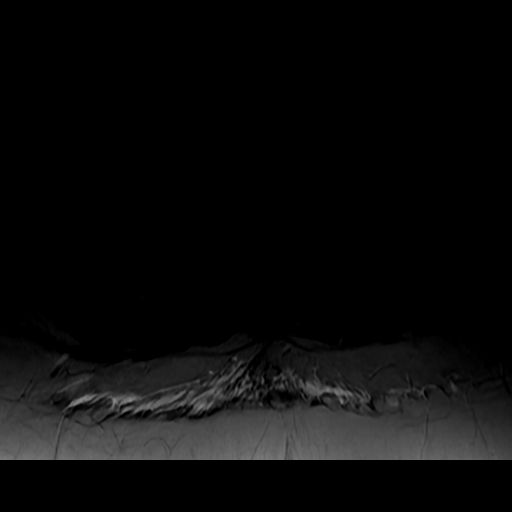
[im 8/55]
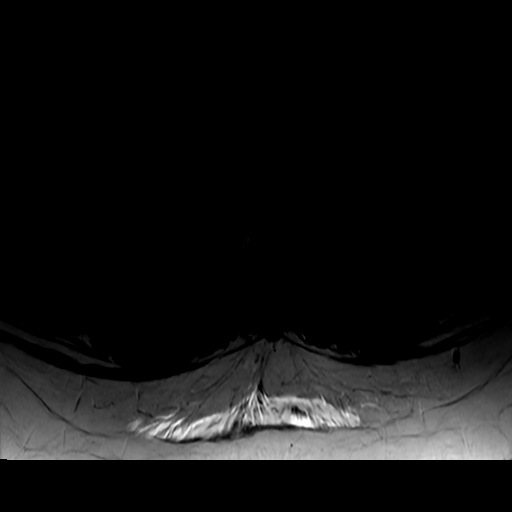
[im 16/55]
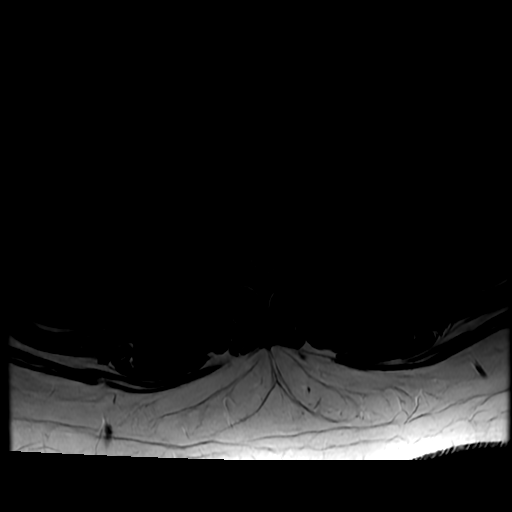
[im 24/55]
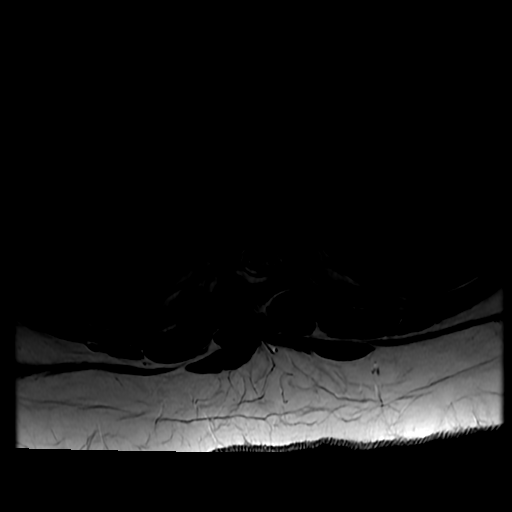
[im 31/55]
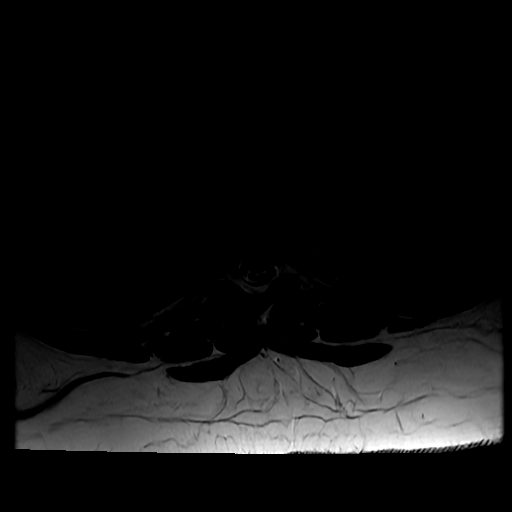
[im 39/55]
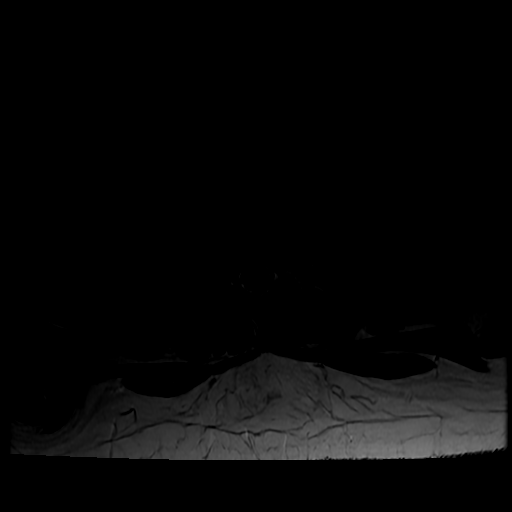
[im 47/55]
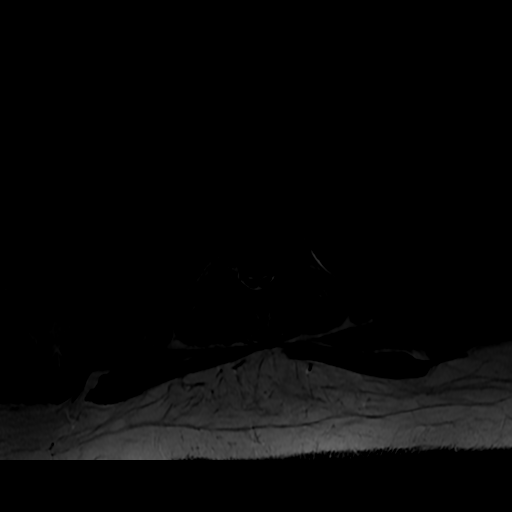
[im 55/55]
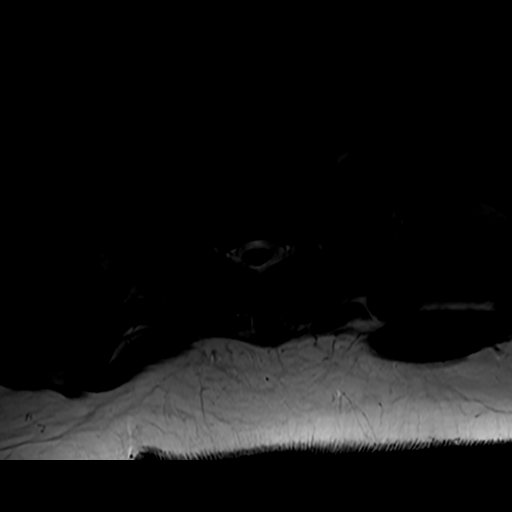

[Series 23: T1 · axial · non-contrast · 3.0mm · 0.39mm/px · z∈[-470,-388]mm · 3 of 65 slices shown (3 of 3)]
[im 1/65]
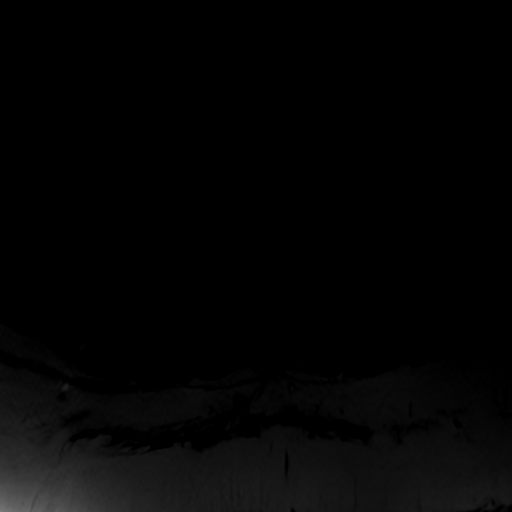
[im 9/65]
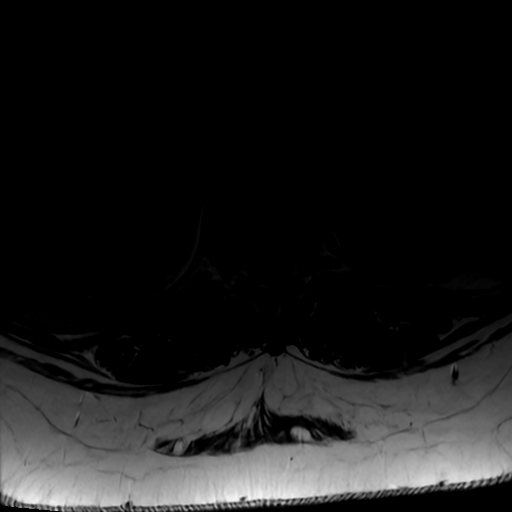
[im 17/65]
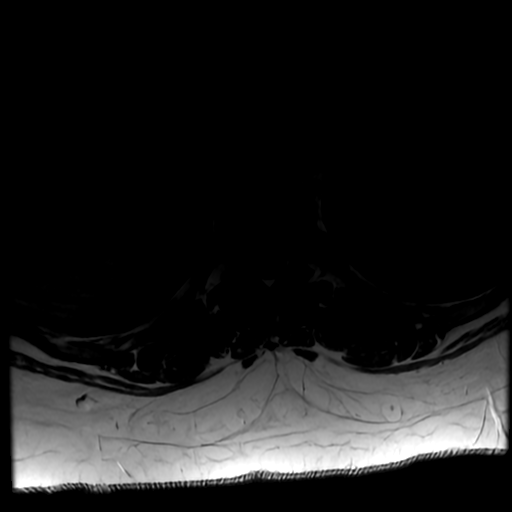

[18 of 48 positions shown; findings below may reference images not displayed]

FINDINGS: MRI CERVICAL SPINE FINDINGS

Alignment: Straightening of the normal cervical lordosis, which may
be positional. No listhesis.

Vertebrae: No acute fracture or suspicious osseous lesion. No
abnormal enhancement.

Cord: Mildly increased T2 signal in the right-greater-than-left
aspect of the spinal cord, extending from inferior aspect of C2
through the superior aspect of C5 (series 10, image 9), with some
areas that are near holocord involvement (series 13, image 10).
Additional more focal and more intense areas of T2 hyperintense
signal in the dorsal left paracentral cord at C4 (series 13, image
11), although this is only seen on a single slice. Possible
additional focus in the dorsal cord at C6 (series 13, images
23-25)(series 13, image 23). No abnormal cord enhancement.

Posterior Fossa, vertebral arteries, paraspinal tissues: Negative.

Disc levels:

C4-C5: Small left paracentral disc protrusion. No spinal canal
stenosis or neural foraminal narrowing.

Small central disc protrusion. No spinal canal stenosis or neural
foraminal narrowing.

MRI THORACIC SPINE FINDINGS

Alignment: Mild S shaped curvature of the thoracolumbar spine. No
listhesis.

Vertebrae: Acute osseous abnormality.  No abnormal enhancement.

Cord: Evaluation is somewhat limited by motion artifact. Possible
area of mildly increased T2 signal in the central cord that extends
over several slices at T3-T4 (series 21, images 12-15 and series 18,
image 10), although this may be artifactual. No abnormal
enhancement.

Paraspinal and other soft tissues: Negative.

Disc levels:

No spinal canal stenosis or neural foraminal narrowing.
IMPRESSION: 1. Areas of increased T2 signal in the cervical spinal cord, with 1
possible cervical lesion extending approximately 2-3 vertebral
bodies in craniocaudal dimension. Additional smaller T2 hyperintense
foci in the spinal cord at C4 and C6 are also seen. These remain
concerning for multiple sclerosis. No abnormal enhancement to
suggest active demyelination.
2. Evaluation of the thoracic spinal cord is somewhat limited by
motion artifact. There is possible T2 hyperintense lesion at T3-T4,
although this may be artifactual. No abnormal enhancement to suggest
active demyelination.
3. No spinal canal stenosis or neural foraminal narrowing.

## 2021-10-09 MED ORDER — RIVAROXABAN 10 MG PO TABS
10.0000 mg | ORAL_TABLET | Freq: Every day | ORAL | Status: DC
  Administered 2021-10-10 – 2021-10-14 (×5): 10 mg via ORAL
  Filled 2021-10-09 (×5): qty 1

## 2021-10-09 MED ORDER — SODIUM CHLORIDE 0.9 % IV BOLUS
1000.0000 mL | Freq: Once | INTRAVENOUS | Status: AC
  Administered 2021-10-09: 1000 mL via INTRAVENOUS

## 2021-10-09 MED ORDER — GADOBUTROL 1 MMOL/ML IV SOLN
10.0000 mL | Freq: Once | INTRAVENOUS | Status: AC | PRN
  Administered 2021-10-09: 10 mL via INTRAVENOUS

## 2021-10-09 MED ORDER — ACETAMINOPHEN 325 MG PO TABS
650.0000 mg | ORAL_TABLET | Freq: Four times a day (QID) | ORAL | Status: DC | PRN
  Administered 2021-10-10: 650 mg via ORAL
  Filled 2021-10-09 (×2): qty 2

## 2021-10-09 MED ORDER — SENNOSIDES-DOCUSATE SODIUM 8.6-50 MG PO TABS: 1.0000 | ORAL_TABLET | Freq: Every evening | ORAL | Status: DC | PRN

## 2021-10-09 MED ORDER — LORAZEPAM 2 MG/ML IJ SOLN: 1.0000 mg | Freq: Once | INTRAMUSCULAR | Status: DC | PRN

## 2021-10-09 MED ORDER — ACETAMINOPHEN 650 MG RE SUPP: 650.0000 mg | Freq: Four times a day (QID) | RECTAL | Status: DC | PRN

## 2021-10-09 NOTE — ED Notes (Signed)
Remains in MRI at this time 

## 2021-10-09 NOTE — ED Provider Notes (Signed)
[facility] EMERGENCY DEPARTMENT Provider Note   CSN: [id number] Arrival date & time: 10/09/21  1600     History  Chief Complaint  Patient presents with   Back Pain   Numbness    Shelley Simon is a 21 y.o. female history of obesity here presenting with dizziness and numbness.  Patient started having right arm numbness about a week ago.  She states that 5 days ago, she noticed that she is weaker on the right arm and leg.  She also has been more confused.  She takes online classes from home.  Per the mother, patient has not been able to complete her task recently.  Patient also became very forgetful and has trouble with her handwriting.  Patient also has trouble walking and " appears drunk" when she walks.  Both patient and mother adamantly denies drinking alcohol or doing any drugs.  Patient denies any fevers or viral syndrome.  Patient also has some back pain as well.  The history is provided by the patient.      Home Medications Prior to Admission medications   Not on File      Allergies    Amoxicillin, Penicillins, and Zithromax [azithromycin]    Review of Systems   Review of Systems  Musculoskeletal:  Positive for back pain.  Neurological:  Positive for dizziness.  All other systems reviewed and are negative.  Physical Exam Updated Vital Signs BP (!) 118/102 (BP Location: Right Arm)   Pulse (!) 117   Temp 98.1 F (36.7 C) (Oral)   Resp (!) 22   Ht 5\' 5"  (1.651 m)   Wt 117.9 kg   SpO2 100%   BMI 43.27 kg/m  Physical Exam Vitals and nursing note reviewed.  Constitutional:      Comments: Overweight  HENT:     Head: Normocephalic.     Nose: Nose normal.     Mouth/Throat:     Mouth: Mucous membranes are dry.  Eyes:     Extraocular Movements: Extraocular movements intact.     Pupils: Pupils are equal, round, and reactive to light.  Cardiovascular:     Rate and Rhythm: Regular rhythm. Tachycardia present.     Pulses: Normal pulses.      Heart sounds: Normal heart sounds.  Pulmonary:     Effort: Pulmonary effort is normal.     Breath sounds: Normal breath sounds.  Abdominal:     General: Abdomen is flat.     Palpations: Abdomen is soft.  Musculoskeletal:        General: Normal range of motion.     Cervical back: Normal range of motion and neck supple.  Skin:    General: Skin is warm.     Capillary Refill: Capillary refill takes less than 2 seconds.  Neurological:     Comments: Patient has dysmetria bilateral arms.  Patient strength is 4 out of 5 right arm and right leg and 5 out of 5 in the left arm and left leg.  Patient has no obvious facial droop or slurred speech.  Cranial nerves II to XII is grossly intact.  Patient does have a wide-based gait as well  Psychiatric:        Mood and Affect: Mood normal.        Behavior: Behavior normal.    ED Results / Procedures / Treatments   Labs (all labs ordered are listed, but only abnormal results are displayed) Labs Reviewed  COMPREHENSIVE METABOLIC PANEL - Abnormal;  Notable for the following components:      Result Value   Calcium 8.8 (*)    All other components within normal limits  CBC WITH DIFFERENTIAL/PLATELET - Abnormal; Notable for the following components:   Platelets 432 (*)    All other components within normal limits  URINALYSIS, ROUTINE W REFLEX MICROSCOPIC - Abnormal; Notable for the following components:   APPearance CLOUDY (*)    Hgb urine dipstick MODERATE (*)    Bacteria, UA FEW (*)    All other components within normal limits  AMMONIA  RAPID URINE DRUG SCREEN, HOSP PERFORMED  TSH  I-STAT BETA HCG BLOOD, ED ([facility], [facility], [facility] ONLY)    EKG EKG Interpretation  Date/Time:  Thursday Oct 09 2021 19:22:48 EDT Ventricular Rate:  110 PR Interval:  132 QRS Duration: 85 QT Interval:  319 QTC Calculation: 432 R Axis:   90 Text Interpretation: Sinus tachycardia Borderline right axis deviation No significant change since last tracing Confirmed by Richardean Canal [phone]) on 10/09/2021 7:26:00 PM  Radiology DG Lumbar Spine Complete  Result Date: 10/09/2021 CLINICAL DATA:  Low back pain EXAM: LUMBAR SPINE - COMPLETE 4+ VIEW COMPARISON:  None FINDINGS: Transitional anatomy. There are either diminutive first lumbar ribs or a sacralized L5 segment. Alignment is normal. Disc heights are normal. No evidence of facet arthropathy or pars defect. Sacroiliac joints appear normal. IMPRESSION: Transitional anatomy. No evidence of degenerative change or pars defect. No abnormality seen to explain pain. Electronically Signed   By: Paulina Fusi M.D.   On: 10/09/2021 18:09   CT Head Wo Contrast  Result Date: 10/09/2021 CLINICAL DATA:  Headache numbness EXAM: CT HEAD WITHOUT CONTRAST TECHNIQUE: Contiguous axial images were obtained from the base of the skull through the vertex without intravenous contrast. RADIATION DOSE REDUCTION: This exam was performed according to the departmental dose-optimization program which includes automated exposure control, adjustment of the mA and/or kV according to patient size and/or use of iterative reconstruction technique. COMPARISON:  None Available. FINDINGS: Brain: No evidence of acute infarction, hemorrhage, hydrocephalus, extra-axial collection or mass lesion/mass effect. Vascular: No hyperdense vessel or unexpected calcification. Skull: Normal. Negative for fracture or focal lesion. Sinuses/Orbits: No acute finding. Other: None IMPRESSION: Negative non contrasted CT appearance of the brain Electronically Signed   By: Jasmine Pang M.D.   On: 10/09/2021 17:44    Procedures Procedures    CRITICAL CARE Performed by: Richardean Canal   Total critical care time: 30 minutes  Critical care time was exclusive of separately billable procedures and treating other patients.  Critical care was necessary to treat or prevent imminent or life-threatening deterioration.  Critical care was time spent personally by me on the following activities:  development of treatment plan with patient and/or surrogate as well as nursing, discussions with consultants, evaluation of patient's response to treatment, examination of patient, obtaining history from patient or surrogate, ordering and performing treatments and interventions, ordering and review of laboratory studies, ordering and review of radiographic studies, pulse oximetry and re-evaluation of patient's condition.\  Medications Ordered in ED Medications  LORazepam (ATIVAN) injection 1 mg (has no administration in time range)  sodium chloride 0.9 % bolus 1,000 mL (has no administration in time range)    ED Course/ Medical Decision Making/ A&P                           Medical Decision Making Heidy A Busk is a 21 y.o. female  here presenting with dizziness and trouble walking.  Patient has right-sided numbness and weakness for about a week.  This is not a ascending paralysis.  I have low suspicion for Guillain-Barr syndrome.  I think likely multiple sclerosis versus stroke.  I discussed case with Dr. Iver Nestle from neurology.  She recommend MRI brain and cervical spine and thoracic spine.  She states that since there is confusion and also arm involvement, the lesion has to be cervical spine or higher so we will hold off on MRI of the lumbar spine.   10:35 PM Patient's MRI brain showed multiple sclerosis.  I reviewed case with Dr. Iver Nestle from neurology.  She felt that there is some active demyelinating lesions.  She states that she will see the patient and dose the steroids.  She is considering doing LP prior to dosing the steroids.  I tried to palpate for landmarks but her BMI is 43 and I was unable to visualize the millimeter margins to safely do an LP.  Will defer LP to interventional radiology if neurology still wants an LP.  Patient has no fevers and is not confused currently.  I do not think she has meningitis right now.  Internal medicine to admit  Problems Addressed: Multiple sclerosis  (HCC): acute illness or injury  Amount and/or Complexity of Data Reviewed Labs: ordered. Decision-making details documented in ED Course. Radiology: ordered and independent interpretation performed. Decision-making details documented in ED Course. ECG/medicine tests: ordered and independent interpretation performed. Decision-making details documented in ED Course.  Risk Prescription drug management. Decision regarding hospitalization.    Final Clinical Impression(s) / ED Diagnoses Final diagnoses:  None    Rx / DC Orders ED Discharge Orders     None         Charlynne Pander, MD 10/09/21 2250

## 2021-10-09 NOTE — ED Notes (Signed)
Verbal report given to Patria Mane RN at this time

## 2021-10-09 NOTE — ED Provider Triage Note (Signed)
Emergency Medicine Provider Triage Evaluation Note  Shelley Simon , a 21 y.o. female  was evaluated in triage.  Pt complains of ascending numbness to all 4 of her extremities.  This started roughly a week ago.  Patient was seen evaluated at 2 urgent cares forced this in addition to lower back pain.  She was started on muscle relaxers.  Patient having trouble writing and the mom has noticed some increased confusion and forgetfulness.  Review of Systems  Positive:  Negative: See above   Physical Exam  BP (!) 125/104   Pulse (!) 126   Temp 98.1 F (36.7 C) (Oral)   Resp 18   Ht 5\' 5"  (1.651 m)   Wt 117.9 kg   SpO2 100%   BMI 43.27 kg/m  Gen:   Awake, no distress   Resp:  Normal effort  MSK:   Moves extremities without difficulty  Other:  Cranial nerves II to XII are intact.  The biceps 5 strength to the upper and lower extremities.  Patient does walk with a more shuffling gait.  Medical Decision Making  Medically screening exam initiated at 4:30 PM.  Appropriate orders placed.  Monisha A Lannom was informed that the remainder of the evaluation will be completed by another provider, this initial triage assessment does not replace that evaluation, and the importance of remaining in the ED until their evaluation is complete.  Patient unable to write her name clearly on an napkin.  She is unable to hold the pen effectively.   [place], [place] 10/09/21 [phone]

## 2021-10-09 NOTE — H&P (Signed)
Date: 10/09/2021               Patient Name:  Shelley Simon MRN: [id number]  DOB: 2000/10/09 Age / Sex: 21 y.o., female   PCP: Lucianne Lei, MD         Medical Service: Internal Medicine Teaching Service         Attending Physician: Dr. Ginnie Smart, MD    First Contact: Dr. Earlean Shawl Pager: [phone]  Second Contact: Dr. Marolyn Haller Pager: [phone]       After Hours (After 5p/  First Contact Pager: [phone]  weekends / holidays): Second Contact Pager: [phone]   Chief Complaint: extremity numbness/weakness   History of Present Illness: Shelley Simon is a 21 yo female with history of severe obesity and depression/anxiety that is presenting with progressively worsening extremity numbness and weakness and lower back pain for five days. She reports that the numbness initially started in her hands (right > left) and has now spread "everywhere". 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 She also reports difficulty walking and gait instability due to feeling weaker on the right side. As such, also has not been driving due to inability to hold the steering wheel and difficulty getting into and out of the car. She denies any injuries or trauma, headaches, vision changes, fevers/chills, chest pain, shortness of breath, bowel or bladder incontinence, or urinary symptoms. Of note, patient does report intermittent episodes of hand numbness previously although this usually improved without intervention.  Mother reports that she had a telehealth appointment on Tuesday for this and as prescribed zanaflex and celebrex for suspected nerve impingement. Patient reports having no relief with this although did have worsening confusion with the new medications. She went to urgent care in [place] and NP recommended she come to [facility] to see a neurologist.     Medications:  Cymbalta 60 mg nightly  Allergies: Allergies as of 10/09/2021 - Review Complete 10/09/2021  Allergen Reaction Noted   Amoxicillin Hives 10/09/2021   Augmentin [amoxicillin-pot clavulanate] Hives 10/09/2021   Penicillins Hives 10/09/2021   Zithromax [azithromycin] Hives 10/09/2021   History reviewed. No pertinent past medical history.  Family History: maternal grandparents died of Alzheimer's. Maternal aunt died of colon cancer at age 68. Maternal grandfather had colon cancer. No autoimmune disease.    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 Lives with her mother. She is the oldest of three children. Independent of ADL and IADLs.  PCP: Dr. Lucianne Lei  No tobacco use, no alcohol, no illicit drug use.   Review of Systems: A complete ROS was negative except as per HPI.   Physical Exam: Blood pressure 133/73, pulse (!) 101, temperature 98.1 F (36.7 C), temperature source Oral, resp. rate (!) 21, height 5\' 5"  (1.651 m), weight 117.9 kg, SpO2 100 %. Physical Exam  Constitutional: Appears well-developed and well-nourished. No distress.  HENT: Normocephalic and atraumatic, EOMI, conjunctiva normal, moist mucous membranes Cardiovascular: Mildly tachycardic, regular rhythm, S1 and S2 present, no murmurs, rubs, gallops.  Distal pulses intact Respiratory: Effort is normal.  Lungs are clear to auscultation bilaterally. GI: Nondistended, soft, nontender to palpation, normal bowel sounds Musculoskeletal: Normal bulk and tone.  No peripheral edema noted. Neurological: Is alert and oriented x4, CN II-XII  grossly intact. Strength 4/5 in RUE, 5/5 in LUE, 5/5 in bilat lower extremities. Diminished sensation to light touch in RUE. Normal heel-to-shin bilaterally. Abnl finger-to-nose bilaterally.  Skin: Warm and dry.  No rash, erythema, lesions noted. Psychiatric: Tearful. Normal affect. Behavior and judgement  normal.    EKG: personally reviewed my interpretation is sinus tachycardia, HR 110, Qtc  CT HEAD WO CONTRAST:  IMPRESSION: Negative non contrasted CT appearance of the brain   XR LUMBAR SPINE:  IMPRESSION: Transitional anatomy. No evidence of degenerative change or pars defect. No abnormality seen to explain pain.  MRI BRAIN W WO CONTRAST :  IMPRESSION: 1. Radially oriented T2 hyperintense foci in the periventricular white matter, which are concerning for multiple sclerosis. No enhancing lesions to suggest active demyelination. 2. No additional acute intracranial process. No evidence of acute or subacute infarct.  MR CERVICAL AND THORACIC SPINE W WO CONTRAST:  IMPRESSION: 1. Areas of increased T2 signal in the cervical spinal cord, with 1 possible cervical lesion extending approximately 2-3 vertebral bodies in craniocaudal dimension. Additional smaller T2 hyperintense foci in the spinal cord at C4 and C6 are also seen. These remain concerning for multiple sclerosis. No abnormal enhancement to suggest active demyelination. 2. Evaluation of the thoracic spinal cord is somewhat limited by motion artifact. There is possible T2 hyperintense lesion at T3-T4, although this may be artifactual. No abnormal enhancement to suggest active demyelination. 3. No spinal canal stenosis or neural foraminal narrowing.  CBC    Component Value Date/Time   WBC 9.5 10/09/2021 1635   RBC 4.59 10/09/2021 1635   HGB 13.1 10/09/2021 1635   HCT 40.6 10/09/2021 1635   PLT 432 (H) 10/09/2021 1635   MCV 88.5 10/09/2021 1635   MCH 28.5 10/09/2021 1635   MCHC 32.3 10/09/2021 1635   RDW 13.8 10/09/2021 1635   LYMPHSABS 1.6 10/09/2021 1635   MONOABS 0.5 10/09/2021 1635   EOSABS 0.1 10/09/2021 1635   BASOSABS 0.1 10/09/2021 1635       Latest Ref Rng & Units 10/09/2021    4:35 PM  BMP  Glucose 70 - 99 mg/dL 94    BUN 6 - 20 mg/dL 12    Creatinine 3.73 - 1.00 mg/dL 4.28    Sodium 768 -  115 mmol/L 138    Potassium 3.5 - 5.1 mmol/L 3.9    Chloride 98 - 111 mmol/L 105    CO2 22 - 32 mmol/L 25    Calcium 8.9 - 10.3 mg/dL 8.8       Assessment & Plan by Problem: Principal Problem:   Multiple sclerosis (HCC)  Patient is a 21 year old female with history of severe obesity and depression/anxiety presenting with progressive neuropathy and weakness admitted for new-onset multiple sclerosis.   Multiple sclerosis flare Patient is presenting with five days of progressive peripheral neuropathy and weakness, especially with fine motor skills such as typing. She also endorsed memory issues and intermittent dizziness/lightheadedness for this duration as well without headaches or vision changes. On physical exam, patient has decreased sensation and strength of the RUE, abnormal finger-to-nose test on bilateral with normal sensation and heel-to-shin and strength on the bilateral lower extremities. MRI Brain and Cervical/Thoracic spine consistent with multiple sclerosis without evidence of active demyelination noted.  - Neurology consulted, appreciate recommendations - High dose steroids; however, may get LP prior to initiation of steroids  - PT/OT eval   Depression/Anxiety - Resume home Cymbalta 60mg  daily   Best Practice: Diet: Regular diet IVF:  Fluids: None, Rate: None VTE: rivaroxaban (XARELTO) tablet 10 mg Start: 10/10/21 1000 Code: Full AB: None Status: Observation with expected length of stay less than 2 midnights. Anticipated Discharge Location: Home Barriers to Discharge: Medical stability   Dispo: Admit patient to Observation with expected length of stay less than 2 midnights.  Signed: Eliezer Bottom, MD IMTS PGY-3 10/09/2021, 11:49 PM  Pager: [phone] After 5pm on weekdays and 1pm on weekends: On Call pager: [phone]

## 2021-10-09 NOTE — ED Triage Notes (Addendum)
Pt arrived POV from home c/o lower back pain and bilateral upper and lower extremity numbness. Pt states it feels like her body is asleep. Pt is also twitching in her upper extremities.

## 2021-10-09 NOTE — ED Notes (Signed)
Has been transported to MRI

## 2021-10-09 NOTE — ED Notes (Signed)
Received verbal report from Tiffany S RN at this time 

## 2021-10-10 ENCOUNTER — Encounter (HOSPITAL_COMMUNITY): Payer: Self-pay | Admitting: Infectious Diseases

## 2021-10-10 DIAGNOSIS — G35 Multiple sclerosis: Secondary | ICD-10-CM

## 2021-10-10 DIAGNOSIS — Z881 Allergy status to other antibiotic agents status: Secondary | ICD-10-CM | POA: Diagnosis not present

## 2021-10-10 DIAGNOSIS — Z880 Allergy status to penicillin: Secondary | ICD-10-CM | POA: Diagnosis not present

## 2021-10-10 DIAGNOSIS — I959 Hypotension, unspecified: Secondary | ICD-10-CM | POA: Diagnosis present

## 2021-10-10 DIAGNOSIS — F419 Anxiety disorder, unspecified: Secondary | ICD-10-CM | POA: Diagnosis present

## 2021-10-10 DIAGNOSIS — Z6841 Body Mass Index (BMI) 40.0 and over, adult: Secondary | ICD-10-CM | POA: Diagnosis not present

## 2021-10-10 DIAGNOSIS — F32A Depression, unspecified: Secondary | ICD-10-CM | POA: Diagnosis present

## 2021-10-10 DIAGNOSIS — T380X5A Adverse effect of glucocorticoids and synthetic analogues, initial encounter: Secondary | ICD-10-CM | POA: Diagnosis present

## 2021-10-10 DIAGNOSIS — R739 Hyperglycemia, unspecified: Secondary | ICD-10-CM | POA: Diagnosis present

## 2021-10-10 LAB — BASIC METABOLIC PANEL
Anion gap: 5 (ref 5–15)
BUN: 11 mg/dL (ref 6–20)
CO2: 23 mmol/L (ref 22–32)
Calcium: 8.5 mg/dL — ABNORMAL LOW (ref 8.9–10.3)
Chloride: 108 mmol/L (ref 98–111)
Creatinine, Ser: 0.81 mg/dL (ref 0.44–1.00)
GFR, Estimated: >60 mL/min (ref >60)
Glucose, Bld: 94 mg/dL (ref 70–99)
Potassium: 3.7 mmol/L (ref 3.5–5.1)
Sodium: 136 mmol/L (ref 135–145)

## 2021-10-10 LAB — CBC
HCT: 35.5 % — ABNORMAL LOW (ref 36.0–46.0)
Hemoglobin: 11.6 g/dL — ABNORMAL LOW (ref 12.0–15.0)
MCH: 28.4 pg (ref 26.0–34.0)
MCHC: 32.7 g/dL (ref 30.0–36.0)
MCV: 86.8 fL (ref 80.0–100.0)
Platelets: 355 10*3/uL (ref 150–400)
RBC: 4.09 MIL/uL (ref 3.87–5.11)
RDW: 13.7 % (ref 11.5–15.5)
WBC: 7.5 10*3/uL (ref 4.0–10.5)
nRBC: 0 % (ref 0.0–0.2)

## 2021-10-10 LAB — GLUCOSE, CAPILLARY
Glucose-Capillary: 102 mg/dL — ABNORMAL HIGH (ref 70–99)
Glucose-Capillary: 123 mg/dL — ABNORMAL HIGH (ref 70–99)
Glucose-Capillary: 137 mg/dL — ABNORMAL HIGH (ref 70–99)
Glucose-Capillary: 186 mg/dL — ABNORMAL HIGH (ref 70–99)

## 2021-10-10 LAB — HEMOGLOBIN A1C
Hgb A1c MFr Bld: 4.7 % — ABNORMAL LOW (ref 4.8–5.6)
Mean Plasma Glucose: 88.19 mg/dL

## 2021-10-10 LAB — HIV ANTIBODY (ROUTINE TESTING W REFLEX): HIV Screen 4th Generation wRfx: NONREACTIVE

## 2021-10-10 MED ORDER — INSULIN ASPART 100 UNIT/ML IJ SOLN
0.0000 [IU] | Freq: Three times a day (TID) | INTRAMUSCULAR | Status: DC
  Administered 2021-10-10 – 2021-10-11 (×3): 3 [IU] via SUBCUTANEOUS
  Administered 2021-10-11: 4 [IU] via SUBCUTANEOUS
  Administered 2021-10-11: 3 [IU] via SUBCUTANEOUS
  Administered 2021-10-12: 4 [IU] via SUBCUTANEOUS
  Administered 2021-10-12 – 2021-10-13 (×2): 3 [IU] via SUBCUTANEOUS
  Administered 2021-10-13 – 2021-10-14 (×2): 4 [IU] via SUBCUTANEOUS

## 2021-10-10 MED ORDER — HYDROXYZINE HCL 25 MG PO TABS
25.0000 mg | ORAL_TABLET | Freq: Three times a day (TID) | ORAL | Status: DC | PRN
  Administered 2021-10-10: 25 mg via ORAL
  Filled 2021-10-10 (×2): qty 1

## 2021-10-10 MED ORDER — SODIUM CHLORIDE 0.9 % IV SOLN
1000.0000 mg | INTRAVENOUS | Status: AC
  Administered 2021-10-10 – 2021-10-14 (×5): 1000 mg via INTRAVENOUS
  Filled 2021-10-10 (×6): qty 16

## 2021-10-10 MED ORDER — DULOXETINE HCL 60 MG PO CPEP
60.0000 mg | ORAL_CAPSULE | Freq: Every day | ORAL | Status: DC
  Administered 2021-10-10 – 2021-10-13 (×5): 60 mg via ORAL
  Filled 2021-10-10 (×6): qty 1

## 2021-10-10 NOTE — Evaluation (Signed)
Physical Therapy Evaluation Patient Details Name: Shelley Simon MRN: [id number] DOB: 04-Jun-2000 Today's Date: 10/10/2021  History of Present Illness  21 y/o female presented to ED on 10/09/21 for c/o lower back pain, weakness, and bilateral UE and LE numbness. Imaging consistent with diagnosis of multiple sclerosis. PMH: depression/anxiety  Clinical Impression  Patient admitted with the above findings. 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 Patient currently presents with impaired sensation, weakness, impaired coordination, and impaired balance. Patient with mild ataxia with mobility due to numbness in B LE. Overall requires min guard for mobility with and without RW. Patient demos improved stability with use of AD. Patient does endorse multiple near falls PTA where people had to catch her due to LE weakness and buckling. Patient will benefit from skilled PT services during acute stay to address listed deficits. Recommend OPPT at discharge to address balance and strength deficits. Will follow acutely and update recommendations as necessary.      Recommendations for follow up therapy are one component of a multi-disciplinary discharge planning process, led by the attending physician.  Recommendations may be updated based on patient status, additional functional criteria and insurance authorization.  Follow Up Recommendations Outpatient PT    Assistance Recommended at Discharge Intermittent Supervision/Assistance  Patient can return home with the following  A little help with bathing/dressing/bathroom;A little help with walking and/or transfers;Assistance with cooking/housework;Assist for transportation;Help with stairs or ramp for entrance    Equipment Recommendations Rolling Faiga Stones (2 wheels)  Recommendations for Other Services       Functional Status Assessment Patient has had a recent decline in their functional status and  demonstrates the ability to make significant improvements in function in a reasonable and predictable amount of time.     Precautions / Restrictions Precautions Precautions: Fall Restrictions Weight Bearing Restrictions: No      Mobility  Bed Mobility Overal bed mobility: Modified Independent                  Transfers Overall transfer level: Needs assistance Equipment used: None Transfers: Sit to/from Stand Sit to Stand: Min guard           General transfer comment: min guard for safety    Ambulation/Gait Ambulation/Gait assistance: Min guard Gait Distance (Feet): 10 Feet (+20') Assistive device: None, Rolling Darrio Bade (2 wheels) Gait Pattern/deviations: Step-through pattern, Decreased stride length, Ataxic Gait velocity: decreased     General Gait Details: ambulated around bed with no AD but patient demonstrating mild ataxic and guarded gait pattern. Patient fearful of falling. Min guard for safety. Trialed use of RW in room with improved comfort but continues to demo mild ataxia  Stairs            Wheelchair Mobility    Modified Rankin (Stroke Patients Only)       Balance Overall balance assessment: Mild deficits observed, not formally tested, History of Falls (very hesitant due to near falls prior to admission)                                           Pertinent Vitals/Pain Pain Assessment Pain Assessment: Faces Faces Pain Scale: Hurts even more Pain Location: hands and feet Pain Descriptors / Indicators: Numbness Pain Intervention(s): Monitored during session    Home Living Family/patient expects to be discharged to:: Private residence Living Arrangements: Other  relatives;Non-relatives/Friends (grandparents, cousin)   Type of Home: House Home Access: Ramped entrance       Home Layout: One level Home Equipment: Agricultural consultant (2 wheels)      Prior Function Prior Level of Function : Independent/Modified  Independent;Driving             Mobility Comments: patient reports multiple near falls where people have caught her ADLs Comments: 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     Hand Dominance   Dominant Hand: Right    Extremity/Trunk Assessment   Upper Extremity Assessment Upper Extremity Assessment: Defer to OT evaluation RUE Deficits / Details: numbness    Lower Extremity Assessment Lower Extremity Assessment: RLE deficits/detail;LLE deficits/detail RLE Deficits / Details: Grossly 4-/5 RLE Sensation: decreased light touch;decreased proprioception RLE Coordination: decreased fine motor;decreased gross motor LLE Deficits / Details: Grossly 4/5 LLE Sensation: decreased light touch;decreased proprioception LLE Coordination: decreased fine motor;decreased gross motor    Cervical / Trunk Assessment Cervical / Trunk Assessment: Normal  Communication   Communication: No difficulties  Cognition Arousal/Alertness: Awake/alert Behavior During Therapy: WFL for tasks assessed/performed Overall Cognitive Status: Impaired/Different from baseline Area of Impairment: Problem solving                             Problem Solving: Slow processing General Comments: patient reporting slower processing of familiar tasks and needing more time to complete. Unsure if truly cognitive or due to physical limitations from weakness and numbness. Overall pleasant and cooperative throughout        General Comments      Exercises     Assessment/Plan    PT Assessment Patient needs continued PT services  PT Problem List Decreased strength;Decreased activity tolerance;Decreased mobility;Decreased coordination;Decreased balance       PT Treatment Interventions DME instruction;Gait training;Functional mobility training;Therapeutic activities;Therapeutic exercise;Balance training;Neuromuscular re-education;Patient/family education    PT Goals (Current goals can be found in the Care Plan  section)  Acute Rehab PT Goals Patient Stated Goal: to get better PT Goal Formulation: With patient Time For Goal Achievement: 10/24/21 Potential to Achieve Goals: Good    Frequency Min 3X/week     Co-evaluation               AM-PAC PT "6 Clicks" Mobility  Outcome Measure Help needed turning from your back to your side while in a flat bed without using bedrails?: None Help needed moving from lying on your back to sitting on the side of a flat bed without using bedrails?: None Help needed moving to and from a bed to a chair (including a wheelchair)?: A Little Help needed standing up from a chair using your arms (e.g., wheelchair or bedside chair)?: A Little Help needed to walk in hospital room?: A Little Help needed climbing 3-5 steps with a railing? : A Lot 6 Click Score: 19    End of Session   Activity Tolerance: Patient tolerated treatment well Patient left: in chair;with call bell/phone within reach Nurse Communication: Mobility status PT Visit Diagnosis: Unsteadiness on feet (R26.81);Muscle weakness (generalized) (M62.81);Ataxic gait (R26.0);Other abnormalities of gait and mobility (R26.89);History of falling (Z91.81)    Time: 8119-1478 PT Time Calculation (min) (ACUTE ONLY): 25 min   Charges:   PT Evaluation $PT Eval Moderate Complexity: 1 Mod          Hoby Kawai A. Dan Humphreys PT, DPT Acute Rehabilitation Services Pager [phone] Office [phone]   Viviann Spare 10/10/2021, 12:51 PM

## 2021-10-10 NOTE — Consult Note (Addendum)
Neurology Consultation Reason for Consult: Gait impairment, weakness and sensory issues Requesting Physician: Vernia Buff  CC: Confusion, gait impairment and weakness  History is obtained from: Patient and parents   HPI: Shelley Simon is a 21 y.o. female with a past medical history significant for obesity (BMI 48), anxiety/depression.  For the past year she has had intermittent gait issues, with 5 years of occasional difficulty seeing, as well as episodes of numbness.  She was evaluated for lumbago 1 year ago and started on Cymbalta.  This past week she has had increasing gait difficulties and difficulty using her hands particularly her right hand as well as worsening numbness since Saturday for which she presented to the ED for further evaluation.  Regarding her confusion, for the past year it has taken her longer to understand her course work, for example she is to understand things on reading the months and now will read them repeatedly without fully understanding.  Her mother is also noted that the patient's short-term memory seems to be impaired.  Her parents also note that generally the patient tends to minimize her symptoms and will not complain.  She denies any fevers or infectious symptoms, any difficulty breathing, chest pain or palpitations.  She endorses Lhermitte sign and Uhthoff's phenomenon  ROS: All other review of systems was negative except as noted in the HPI.   History reviewed. No pertinent past medical history.   History reviewed. No pertinent family history. Family history is negative for any autoimmune diseases.  Notably her paternal history is largely unknown (father left the family when she was 55 years old).  She does have Alzheimer's disease in both maternal grandparents as well as a strong family history of colorectal cancer (grandfather, cousins, aunt)  Social History:  reports that she has never smoked. She has never used smokeless tobacco. No history on  file for alcohol use and drug use.  Exam: Current vital signs: BP 128/86 (BP Location: Left Arm)   Pulse (!) 101   Temp 98.2 F (36.8 C) (Oral)   Resp 20   Ht 5\' 4"  (1.626 m)   Wt 127 kg   SpO2 100%   BMI 48.04 kg/m  Vital signs in last 24 hours: Temp:  [98.1 F (36.7 C)-98.2 F (36.8 C)] 98.2 F (36.8 C) (05/26 0036) Pulse Rate:  [101-126] 101 (05/26 0036) Resp:  [18-22] 20 (05/26 0036) BP: (106-133)/(64-104) 128/86 (05/26 0036) SpO2:  [98 %-100 %] 100 % (05/26 0036) Weight:  [117.9 kg-127 kg] 127 kg (05/26 0036)   Physical Exam  Constitutional: Appears well-developed and well-nourished.  Psych: Affect appropriate to situation, mildly anxious but calm and cooperative Eyes: No scleral injection HENT: No oropharyngeal obstruction.  MSK: no joint deformities.  She does have tenderness on palpation of the lumbar spine as well as the cervical spine Cardiovascular: Perfusing extremities well Respiratory: Effort normal, non-labored breathing GI: Soft.  No distension. There is no tenderness.  Skin: Warm dry and intact visible skin  Neuro: Mental Status: Patient is awake, alert, oriented to person, place, month, year, and situation. Patient is able to give a clear and coherent history No signs of aphasia or neglect Cranial Nerves: II: Visual Fields are full. Pupils are equal, round, and reactive to light.  Funduscopic exam reveals normal cup-to-disc ratio and crisp optic disks III,IV, VI: EOMI with diplopia in primary gaze only which patient reports is intermittent and ongoing for at least 1 year.  No nystagmus,  V: Facial sensation is symmetric  to temperature and light touch VII: Facial movement is symmetric.  VIII: hearing is intact to voice X: Uvula elevates symmetrically XI: Shoulder shrug is symmetric. XII: tongue deviates to the right Motor: Tone is normal. Bulk is normal.  4 in the bilateral deltoids, 4 in the bilateral biceps, 4+ in bilateral triceps, 4 - wrist  extension in the right, 4+ wrist extension on the left Sensory: Sensory exam is patchy and challenging.  She reports intact sensation in the face, but diminished sensation rostrally to pinprick and temperature in the back.  She reports diminished cold and pinprick sensation on the left upper extremity and right lower extremity compared to the contralateral side. Deep Tendon Reflexes: 3+ and symmetric in the brachioradialis, biceps and patellae.  Positive Hoffmann's bilaterally Cerebellar: Finger-nose is impaired bilaterally right greater than left Gait:  Deferred    I have reviewed labs in epic and the results pertinent to this consultation are:  Basic Metabolic Panel: Recent Labs  Lab 10/09/21 1635  NA 138  K 3.9  CL 105  CO2 25  GLUCOSE 94  BUN 12  CREATININE 0.88  CALCIUM 8.8*   Lab Results  Component Value Date   ALT 13 10/09/2021   AST 15 10/09/2021   ALKPHOS 99 10/09/2021   BILITOT 0.5 10/09/2021    CBC: Recent Labs  Lab 10/09/21 1635  WBC 9.5  NEUTROABS 7.1  HGB 13.1  HCT 40.6  MCV 88.5  PLT 432*    Coagulation Studies: No results for input(s): LABPROT, INR in the last 72 hours.    I have personally reviewed the images obtained, and reviewed them with the patient and father at bedside  1. Radially oriented T2 hyperintense foci in the periventricular white matter, which are concerning for multiple sclerosis. No enhancing lesions to suggest active demyelination. 2. No additional acute intracranial process. No evidence of acute or subacute infarct.  1. Areas of increased T2 signal in the cervical spinal cord, with 1 possible cervical lesion extending approximately 2-3 vertebral bodies in craniocaudal dimension. Additional smaller T2 hyperintense foci in the spinal cord at C4 and C6 are also seen. These remain concerning for multiple sclerosis. No abnormal enhancement to suggest active demyelination [on my personal review, I do believe there is some slight  contrast-enhancement in this lesion]. 2. Evaluation of the thoracic spinal cord is somewhat limited by motion artifact. There is possible T2 hyperintense lesion at T3-T4, although this may be artifactual. No abnormal enhancement to suggest active demyelination. 3. No spinal canal stenosis or neural foraminal narrowing.  Impression: This is a 21 year old woman presenting with gait instability and sensory symptoms as well as weakness in the bilateral upper extremities most concerning for cervical spine localization.  Additionally her imaging is consistent with a diagnosis of multiple sclerosis.  Given her clinical history she meets criteria for dissemination in time and imaging supports dissemination in space.  Furthermore on my personal review I do feel that there is slight contrast-enhancement in the largest cervical spine lesion, and this would certainly fit her current symptoms.  Given the confusion reported is fairly mild, I do not feel that there are any atypical features here.  Regarding her lumbar pain, I think this may be secondary to gait instability and weakness from multiple sclerosis.  Given her  Recommendations:  #Multiple sclerosis meeting 2017 McDonald criteria -1 g Solu-Medrol daily for 5-day course -Blood pressure and blood glucose monitoring (recommend sliding scale insulin with meals but not nightly given blood glucoses rise  from steroids is typically prandial) -Continue home Cymbalta -Neurology will continue to follow  Brooke Dare MD-PhD [facility] [phone] Available 7 PM to 7 AM, outside of these hours please call Neurologist on call as listed on Amion.

## 2021-10-10 NOTE — Progress Notes (Signed)
[facility] Daily Student Progress Note  HD#0 Subjective:  Shelley Simon is a 21 year old female with history of severe obesity and depression/anxiety presenting with progressive neuropathy and weakness admitted for new-onset multiple sclerosis.   Overnight Events: NAEON  Patient seen and assessed at bedside. Reports doing alright. States feeling about the "same" regarding motor symptoms, especially fine motor movements. No problems with blurry vision. Denies having nuchal rigidity.   Mom noticed sporadic wrist jerking in January and back pain last year. Noted to have hand tremors.   She has never traveled outside [place].  Objective:  Vital signs in last 24 hours: Vitals:   10/09/21 2200 10/09/21 2326 10/10/21 0036 10/10/21 0438  BP: 106/64 133/73 128/86 (!) 138/109  Pulse: (!) 116 (!) 101 (!) 101 (!) 102  Resp: 19 (!) 21 20 19   Temp:  98.1 F (36.7 C) 98.2 F (36.8 C) 98 F (36.7 C)  TempSrc:  Oral Oral Oral  SpO2: 98% 100% 100% 99%  Weight:   127 kg   Height:   5\' 4"  (1.626 m)   Sinus Tachycardia on 10/09/21 EKG  Supplemental O2: Room Air SpO2: 99 %   Physical Exam:  Physical Exam Significant dysmetria.  No nuchal rigidity or Lhermitte sign.  Filed Weights   10/09/21 1618 10/10/21 0036  Weight: 117.9 kg 127 kg     Intake/Output Summary (Last 24 hours) at 10/10/2021 0801 Last data filed at 10/10/2021 0659 Gross per 24 hour  Intake 175.59 ml  Output --  Net 175.59 ml    Net IO Since Admission: 175.59 mL [10/10/21 0801]  Pertinent Labs:    Latest Ref Rng & Units 10/10/2021    4:50 AM 10/09/2021    4:35 PM  CBC  WBC 4.0 - 10.5 K/uL 7.5   9.5    Hemoglobin 12.0 - 15.0 g/dL 44.8   18.5    Hematocrit 36.0 - 46.0 % 35.5   40.6    Platelets 150 - 400 K/uL 355   432         Latest Ref Rng & Units 10/10/2021    4:50 AM 10/09/2021    4:35 PM  CMP  Glucose 70 - 99 mg/dL 94   94    BUN 6 - 20 mg/dL 11   12    Creatinine 6.31 - 1.00 mg/dL 4.97   0.26     Sodium 135 - 145 mmol/L 136   138    Potassium 3.5 - 5.1 mmol/L 3.7   3.9    Chloride 98 - 111 mmol/L 108   105    CO2 22 - 32 mmol/L 23   25    Calcium 8.9 - 10.3 mg/dL 8.5   8.8    Total Protein 6.5 - 8.1 g/dL  7.7    Total Bilirubin 0.3 - 1.2 mg/dL  0.5    Alkaline Phos 38 - 126 U/L  99    AST 15 - 41 U/L  15    ALT 0 - 44 U/L  13      Imaging: DG Lumbar Spine Complete  Result Date: 10/09/2021 CLINICAL DATA:  Low back pain EXAM: LUMBAR SPINE - COMPLETE 4+ VIEW COMPARISON:  None FINDINGS: Transitional anatomy. There are either diminutive first lumbar ribs or a sacralized L5 segment. Alignment is normal. Disc heights are normal. No evidence of facet arthropathy or pars defect. Sacroiliac joints appear normal. IMPRESSION: Transitional anatomy. No evidence of degenerative change or pars defect. No abnormality  seen to explain pain. Electronically Signed   By: Paulina Fusi M.D.   On: 10/09/2021 18:09   CT Head Wo Contrast  Result Date: 10/09/2021 CLINICAL DATA:  Headache numbness EXAM: CT HEAD WITHOUT CONTRAST TECHNIQUE: Contiguous axial images were obtained from the base of the skull through the vertex without intravenous contrast. RADIATION DOSE REDUCTION: This exam was performed according to the departmental dose-optimization program which includes automated exposure control, adjustment of the mA and/or kV according to patient size and/or use of iterative reconstruction technique. COMPARISON:  None Available. FINDINGS: Brain: No evidence of acute infarction, hemorrhage, hydrocephalus, extra-axial collection or mass lesion/mass effect. Vascular: No hyperdense vessel or unexpected calcification. Skull: Normal. Negative for fracture or focal lesion. Sinuses/Orbits: No acute finding. Other: None IMPRESSION: Negative non contrasted CT appearance of the brain Electronically Signed   By: Jasmine Pang M.D.   On: 10/09/2021 17:44   MR BRAIN W WO CONTRAST  Result Date: 10/09/2021 CLINICAL DATA:   Pressure in her spine and numbness in her extremities, mental status change, stroke suspected EXAM: MRI HEAD WITHOUT AND WITH CONTRAST TECHNIQUE: Multiplanar, multiecho pulse sequences of the brain and surrounding structures were obtained without and with intravenous contrast. CONTRAST:  46mL GADAVIST GADOBUTROL 1 MMOL/ML IV SOLN COMPARISON:  No prior MRI, correlation is made with CT head 10/09/2021 FINDINGS: Brain: No restricted diffusion to suggest acute or subacute infarct. No acute hemorrhage, mass, mass effect, or midline shift. No hemosiderin deposition to suggest remote hemorrhage. No hydrocephalus or extra-axial collection. Punctate focus of possible contrast enhancement in the pons is not associated with any other abnormal signal and is favored to represent a small capillary telangiectasia. No other abnormal parenchymal or meningeal enhancement. Several ovoid, radially oriented, T2 hyperintense foci are noted in the periventricular white matter (series 6, image 21 and 24, for example). Vascular: Normal arterial flow voids. Skull and upper cervical spine: Normal marrow signal. No abnormal enhancement. Sinuses/Orbits: No acute finding. Other: The mastoids are well aerated. IMPRESSION: 1. Radially oriented T2 hyperintense foci in the periventricular white matter, which are concerning for multiple sclerosis. No enhancing lesions to suggest active demyelination. 2. No additional acute intracranial process. No evidence of acute or subacute infarct. Electronically Signed   By: Wiliam Ke M.D.   On: 10/09/2021 21:48   MR Cervical Spine W or Wo Contrast  Result Date: 10/09/2021 CLINICAL DATA:  Ataxia, stroke suspected EXAM: MRI CERVICAL AND THORACIC SPINE WITHOUT AND WITH CONTRAST TECHNIQUE: Multiplanar and multiecho pulse sequences of the cervical spine, to include the craniocervical junction and cervicothoracic junction, and the thoracic spine, were obtained without and with intravenous contrast. CONTRAST:   25mL GADAVIST GADOBUTROL 1 MMOL/ML IV SOLN COMPARISON:  None Available. FINDINGS: MRI CERVICAL SPINE FINDINGS Alignment: Straightening of the normal cervical lordosis, which may be positional. No listhesis. Vertebrae: No acute fracture or suspicious osseous lesion. No abnormal enhancement. Cord: Mildly increased T2 signal in the right-greater-than-left aspect of the spinal cord, extending from inferior aspect of C2 through the superior aspect of C5 (series 10, image 9), with some areas that are near holocord involvement (series 13, image 10). Additional more focal and more intense areas of T2 hyperintense signal in the dorsal left paracentral cord at C4 (series 13, image 11), although this is only seen on a single slice. Possible additional focus in the dorsal cord at C6 (series 13, images 23-25)(series 13, image 23). No abnormal cord enhancement. Posterior Fossa, vertebral arteries, paraspinal tissues: Negative. Disc levels: C4-C5: Small left paracentral  disc protrusion. No spinal canal stenosis or neural foraminal narrowing. Small central disc protrusion. No spinal canal stenosis or neural foraminal narrowing. MRI THORACIC SPINE FINDINGS Alignment: Mild S shaped curvature of the thoracolumbar spine. No listhesis. Vertebrae: Acute osseous abnormality.  No abnormal enhancement. Cord: Evaluation is somewhat limited by motion artifact. Possible area of mildly increased T2 signal in the central cord that extends over several slices at T3-T4 (series 21, images 12-15 and series 18, image 10), although this may be artifactual. No abnormal enhancement. Paraspinal and other soft tissues: Negative. Disc levels: No spinal canal stenosis or neural foraminal narrowing. IMPRESSION: 1. Areas of increased T2 signal in the cervical spinal cord, with 1 possible cervical lesion extending approximately 2-3 vertebral bodies in craniocaudal dimension. Additional smaller T2 hyperintense foci in the spinal cord at C4 and C6 are also seen.  These remain concerning for multiple sclerosis. No abnormal enhancement to suggest active demyelination. 2. Evaluation of the thoracic spinal cord is somewhat limited by motion artifact. There is possible T2 hyperintense lesion at T3-T4, although this may be artifactual. No abnormal enhancement to suggest active demyelination. 3. No spinal canal stenosis or neural foraminal narrowing. Electronically Signed   By: Wiliam Ke M.D.   On: 10/09/2021 23:38   MR THORACIC SPINE W WO CONTRAST  Result Date: 10/09/2021 CLINICAL DATA:  Ataxia, stroke suspected EXAM: MRI CERVICAL AND THORACIC SPINE WITHOUT AND WITH CONTRAST TECHNIQUE: Multiplanar and multiecho pulse sequences of the cervical spine, to include the craniocervical junction and cervicothoracic junction, and the thoracic spine, were obtained without and with intravenous contrast. CONTRAST:  69mL GADAVIST GADOBUTROL 1 MMOL/ML IV SOLN COMPARISON:  None Available. FINDINGS: MRI CERVICAL SPINE FINDINGS Alignment: Straightening of the normal cervical lordosis, which may be positional. No listhesis. Vertebrae: No acute fracture or suspicious osseous lesion. No abnormal enhancement. Cord: Mildly increased T2 signal in the right-greater-than-left aspect of the spinal cord, extending from inferior aspect of C2 through the superior aspect of C5 (series 10, image 9), with some areas that are near holocord involvement (series 13, image 10). Additional more focal and more intense areas of T2 hyperintense signal in the dorsal left paracentral cord at C4 (series 13, image 11), although this is only seen on a single slice. Possible additional focus in the dorsal cord at C6 (series 13, images 23-25)(series 13, image 23). No abnormal cord enhancement. Posterior Fossa, vertebral arteries, paraspinal tissues: Negative. Disc levels: C4-C5: Small left paracentral disc protrusion. No spinal canal stenosis or neural foraminal narrowing. Small central disc protrusion. No spinal canal  stenosis or neural foraminal narrowing. MRI THORACIC SPINE FINDINGS Alignment: Mild S shaped curvature of the thoracolumbar spine. No listhesis. Vertebrae: Acute osseous abnormality.  No abnormal enhancement. Cord: Evaluation is somewhat limited by motion artifact. Possible area of mildly increased T2 signal in the central cord that extends over several slices at T3-T4 (series 21, images 12-15 and series 18, image 10), although this may be artifactual. No abnormal enhancement. Paraspinal and other soft tissues: Negative. Disc levels: No spinal canal stenosis or neural foraminal narrowing. IMPRESSION: 1. Areas of increased T2 signal in the cervical spinal cord, with 1 possible cervical lesion extending approximately 2-3 vertebral bodies in craniocaudal dimension. Additional smaller T2 hyperintense foci in the spinal cord at C4 and C6 are also seen. These remain concerning for multiple sclerosis. No abnormal enhancement to suggest active demyelination. 2. Evaluation of the thoracic spinal cord is somewhat limited by motion artifact. There is possible T2 hyperintense lesion at T3-T4,  although this may be artifactual. No abnormal enhancement to suggest active demyelination. 3. No spinal canal stenosis or neural foraminal narrowing. Electronically Signed   By: Wiliam Ke M.D.   On: 10/09/2021 23:38    Assessment/Plan:   Principal Problem:   Multiple sclerosis (HCC)   Patient Summary: VALISHA HESLIN is a 21 y.o. with a pertinent PMH of depression/anxiety and obesity, who presented with 5 day history of extremity weakness/numbness and lower back pain and admitted for new onset multiple sclerosis.  #Multiple Sclerosis Flare #Remitting and Relapsing Multiple Sclerosis  -Neurology following, appreciate recommendations -IV Solumedrol 1g x 5 days -SSI to manage hyperglycemia secondary to solumedrol  Depression/Anxiety Secondary to MS Cymbalta 60 mg qhs  Diet: Normal IVF: None,None VTE: Xarelto Code:  Full PT/OT recs: Pending, none. TOC recs: Home   Dispo: Anticipated discharge to Home on 10/10/21 pending completion of steroid course and control of symptoms.   Ander Purpura, Medical Student 10/10/2021, 8:01 AM Pager: [phone]  Please contact the on call pager after 5 pm and on weekends at [phone].

## 2021-10-10 NOTE — Evaluation (Signed)
Occupational Therapy Evaluation Patient Details Name: Shelley Simon MRN: [id number] DOB: 11-09-2000 Today's Date: 10/10/2021   History of Present Illness 21 y/o female presented to ED on 10/09/21 for c/o lower back pain, weakness, and bilateral UE and LE numbness. Imaging consistent with diagnosis of multiple sclerosis. PMH: depression/anxiety   Clinical Impression   Pt is typically independent and is a Ship broker at [employer]. She lives with and was raised by her grandparents. Pt presents with numbness, weakness and incoordination B UEs and impaired standing balance. Pt reports typical activities with a motor output are not automatic and require increased time. She endorses near falls. Pt overall up to min guard assist for ADLs. She ambulates tentatively and slowly, appears more confident with RW. Will follow acutely. Recommending OPOT.     Recommendations for follow up therapy are one component of a multi-disciplinary discharge planning process, led by the attending physician.  Recommendations may be updated based on patient status, additional functional criteria and insurance authorization.   Follow Up Recommendations  Outpatient OT    Assistance Recommended at Discharge Intermittent Supervision/Assistance  Patient can return home with the following Assistance with cooking/housework;A little help with bathing/dressing/bathroom;Help with stairs or ramp for entrance;Assist for transportation    Functional Status Assessment  Patient has had a recent decline in their functional status and demonstrates the ability to make significant improvements in function in a reasonable and predictable amount of time.  Equipment Recommendations  Tub/shower seat    Recommendations for Other Services       Precautions / Restrictions Precautions Precautions: Fall Restrictions Weight Bearing Restrictions: No      Mobility Bed Mobility Overal bed mobility: Modified Independent              General bed mobility comments: HOB up    Transfers Overall transfer level: Needs assistance Equipment used: None, Rolling walker (2 wheels) Transfers: Sit to/from Stand Sit to Stand: Min guard           General transfer comment: min guard for safety      Balance Overall balance assessment: Mild deficits observed, not formally tested, History of Falls (tentative when ambulating, more sure-footed with RW)                                         ADL either performed or assessed with clinical judgement   ADL Overall ADL's : Needs assistance/impaired Eating/Feeding: Independent;Sitting   Grooming: Min guard;Standing   Upper Body Bathing: Minimal assistance;Sitting   Lower Body Bathing: Min guard;Sit to/from stand   Upper Body Dressing : Set up;Sitting   Lower Body Dressing: Min guard;Sit to/from stand Lower Body Dressing Details (indicate cue type and reason): uses UEs to assist LEs into figure 4 to don socks, increased time Toilet Transfer: Min guard;Ambulation   Toileting- Clothing Manipulation and Hygiene: Min guard;Sit to/from stand       Functional mobility during ADLs: Min guard;Rolling walker (2 wheels) General ADL Comments: Pt reports movement requires her to concentrate.     Vision Baseline Vision/History: 0 No visual deficits Patient Visual Report: No change from baseline       Perception     Praxis      Pertinent Vitals/Pain Pain Assessment Pain Assessment: Faces Faces Pain Scale: Hurts even more Pain Location: hands and feet Pain Descriptors / Indicators: Numbness Pain Intervention(s): Monitored during session  Hand Dominance Right   Extremity/Trunk Assessment Upper Extremity Assessment Upper Extremity Assessment: RUE deficits/detail;LUE deficits/detail RUE Deficits / Details: 4-/5 strength, numbness RUE Coordination: decreased fine motor LUE Deficits / Details: 4/5 strength, numbness LUE Coordination:  decreased fine motor   Lower Extremity Assessment Lower Extremity Assessment: Defer to PT evaluation RLE Deficits / Details: Grossly 4-/5 RLE Sensation: decreased light touch;decreased proprioception RLE Coordination: decreased fine motor;decreased gross motor LLE Deficits / Details: Grossly 4/5 LLE Sensation: decreased light touch;decreased proprioception LLE Coordination: decreased fine motor;decreased gross motor   Cervical / Trunk Assessment Cervical / Trunk Assessment: Normal   Communication Communication Communication: No difficulties   Cognition Arousal/Alertness: Awake/alert Behavior During Therapy: WFL for tasks assessed/performed Overall Cognitive Status: Within Functional Limits for tasks assessed                                       General Comments       Exercises     Shoulder Instructions      Home Living Family/patient expects to be discharged to:: Private residence Living Arrangements: Other relatives;Non-relatives/Friends (grandparents and cousin) Available Help at Discharge: Family;Available 24 hours/day Type of Home: House Home Access: Ramped entrance     Home Layout: One level     Bathroom Shower/Tub: Occupational psychologist: Standard     Home Equipment: Conservation officer, nature (2 wheels)          Prior Functioning/Environment Prior Level of Function : Independent/Modified Independent;Driving             Mobility Comments: patient reports multiple near falls where people have caught her ADLs Comments: student at [employer]        OT Problem List: Decreased strength;Impaired balance (sitting and/or standing);Decreased activity tolerance;Decreased coordination;Decreased knowledge of use of DME or AE      OT Treatment/Interventions: Self-care/ADL training;Therapeutic exercise;DME and/or AE instruction;Patient/family education;Balance training;Therapeutic activities    OT Goals(Current goals can be  found in the care plan section) Acute Rehab OT Goals OT Goal Formulation: With patient Time For Goal Achievement: 10/24/21 Potential to Achieve Goals: Good ADL Goals Pt Will Transfer to Toilet: with modified independence;regular height toilet Pt Will Perform Tub/Shower Transfer: with modified independence;ambulating;shower seat Pt/caregiver will Perform Home Exercise Program: Increased strength;Both right and left upper extremity;With theraband;With theraputty Additional ADL Goal #1: Pt will complete basic ADLs modified independently.  OT Frequency: Min 2X/week    Co-evaluation              AM-PAC OT "6 Clicks" Daily Activity     Outcome Measure Help from another person eating meals?: None Help from another person taking care of personal grooming?: A Little Help from another person toileting, which includes using toliet, bedpan, or urinal?: A Little Help from another person bathing (including washing, rinsing, drying)?: A Little Help from another person to put on and taking off regular upper body clothing?: None Help from another person to put on and taking off regular lower body clothing?: A Little 6 Click Score: 20   End of Session Equipment Utilized During Treatment: Rolling walker (2 wheels)  Activity Tolerance: Patient tolerated treatment well Patient left: in chair;with call bell/phone within reach  OT Visit Diagnosis: Other abnormalities of gait and mobility (R26.89);Muscle weakness (generalized) (M62.81)                Time: [id number] OT Time Calculation (min): 25 min  Charges:  OT General Charges $OT Visit: 1 Visit OT Evaluation $OT Eval Moderate Complexity: [place], OTR/L Acute Rehabilitation Services Pager: [phone] Office: [phone]  Malka So 10/10/2021, 3:07 PM

## 2021-10-10 NOTE — TOC Initial Note (Addendum)
Transition of Care [facility]) - Initial/Assessment Note    Patient Details  Name: Shelley Simon MRN: [id number] Date of Birth: 2001/05/06  Transition of Care [facility]) CM/SW Contact:    Tom-Johnson, Renea Ee, RN Phone Number: 10/10/2021, 2:54 PM  Clinical Narrative:                  CM spoke with patient and grandparents at bedside about needs for post hospital transition. Patient's grandparents are listed as her parents as they raised her since she was born. Patient's mother is supportive as well. Admitted for newly diagnosed multiple Sclerosis. On IV Solumedrol. From home, lives with her grandparents. Does not have any children. In school full time and not employed. Independent with care and drive self prior to admission.   PCP is Nicholos Johns, MD and uses [facility] in [place].  PT recommended Outpatient PT. Patient's grandmother states that she will notify CM when she remembers the name of facility they would like to use. Order placed and info on AVS. RW order placed and called in to [employer]. Jodell Cipro to deliver at bedside. CM will continue to follow with needs.  Expected Discharge Plan: OP Rehab Barriers to Discharge: Continued Medical Work up   Patient Goals and CMS Choice Patient states their goals for this hospitalization and ongoing recovery are:: To return home CMS Medicare.gov Compare Post Acute Care list provided to:: Patient Choice offered to / list presented to : Patient, Parent (Gordon parents)  Expected Discharge Plan and Services Expected Discharge Plan: OP Rehab   Discharge Planning Services: CM Consult Post Acute Care Choice:  (Outpatient PT) Living arrangements for the past 2 months: Single Family Home                 DME Arranged: Walker rolling DME Agency: [employer] Date DME Agency Contacted: 10/10/21 Time DME Agency Contacted: [phone] Representative spoke with at DME Agency: [place]: NA          Prior Living  Arrangements/Services Living arrangements for the past 2 months: [place] Lives with:: Parents (Grandparents) Patient language and need for interpreter reviewed:: Yes Do you feel safe going back to the place where you live?: Yes      Need for Family Participation in Patient Care: Yes (Comment) Care giver support system in place?: Yes (comment)   Criminal Activity/Legal Involvement Pertinent to Current Situation/Hospitalization: No - Comment as needed  Activities of Daily Living Home Assistive Devices/Equipment: None ADL Screening (condition at time of admission) Patient's cognitive ability adequate to safely complete daily activities?: Yes Is the patient deaf or have difficulty hearing?: No Does the patient have difficulty seeing, even when wearing glasses/contacts?: No Does the patient have difficulty concentrating, remembering, or making decisions?: Yes Patient able to express need for assistance with ADLs?: Yes Does the patient have difficulty dressing or bathing?: Yes Independently performs ADLs?: No Communication: Independent Dressing (OT): Needs assistance Is this a change from baseline?: Change from baseline, expected to last >3 days Grooming: Needs assistance Is this a change from baseline?: Change from baseline, expected to last >3 days Feeding: Needs assistance Is this a change from baseline?: Change from baseline, expected to last >3 days Bathing: Needs assistance Is this a change from baseline?: Change from baseline, expected to last >3 days Toileting: Needs assistance Is this a change from baseline?: Change from baseline, expected to last >3days In/Out Bed: Needs assistance Is this a change from baseline?: Change from baseline, expected to last >3 days  Walks in Home: Needs assistance Is this a change from baseline?: Change from baseline, expected to last >3 days Does the patient have difficulty walking or climbing stairs?: Yes Weakness of Legs:  Both Weakness of Arms/Hands: Both  Permission Sought/Granted Permission sought to share information with : Case Manager, Customer service manager, Family Supports Permission granted to share information with : Yes, Verbal Permission Granted              Emotional Assessment Appearance:: Appears stated age Attitude/Demeanor/Rapport: Engaged, Gracious Affect (typically observed): Accepting, Appropriate, Calm, Hopeful Orientation: : Oriented to Self, Oriented to Place, Oriented to  Time, Oriented to Situation Alcohol / Substance Use: Not Applicable Psych Involvement: No (comment)  Admission diagnosis:  Multiple sclerosis ([place]) [G35] Patient Active Problem List   Diagnosis Date Noted   Multiple sclerosis ([place]) 10/09/2021   PCP:  Nicholos Johns, MD Pharmacy:   [facility], [place] [id number] [place] [id number] Phone: [phone] Fax: [phone]     Social Determinants of Health (SDOH) Interventions    Readmission Risk Interventions     View : No data to display.

## 2021-10-11 LAB — GLUCOSE, CAPILLARY
Glucose-Capillary: 128 mg/dL — ABNORMAL HIGH (ref 70–99)
Glucose-Capillary: 172 mg/dL — ABNORMAL HIGH (ref 70–99)
Glucose-Capillary: 150 mg/dL — ABNORMAL HIGH (ref 70–99)
Glucose-Capillary: 199 mg/dL — ABNORMAL HIGH (ref 70–99)

## 2021-10-11 LAB — BASIC METABOLIC PANEL
Anion gap: 11 (ref 5–15)
BUN: 13 mg/dL (ref 6–20)
CO2: 21 mmol/L — ABNORMAL LOW (ref 22–32)
Calcium: 8.8 mg/dL — ABNORMAL LOW (ref 8.9–10.3)
Chloride: 102 mmol/L (ref 98–111)
Creatinine, Ser: 0.85 mg/dL (ref 0.44–1.00)
GFR, Estimated: >60 mL/min (ref >60)
Glucose, Bld: 177 mg/dL — ABNORMAL HIGH (ref 70–99)
Potassium: 3.6 mmol/L (ref 3.5–5.1)
Sodium: 134 mmol/L — ABNORMAL LOW (ref 135–145)

## 2021-10-11 LAB — CBC
HCT: 39.2 % (ref 36.0–46.0)
Hemoglobin: 12.7 g/dL (ref 12.0–15.0)
MCH: 28.5 pg (ref 26.0–34.0)
MCHC: 32.4 g/dL (ref 30.0–36.0)
MCV: 88.1 fL (ref 80.0–100.0)
Platelets: 425 10*3/uL — ABNORMAL HIGH (ref 150–400)
RBC: 4.45 MIL/uL (ref 3.87–5.11)
RDW: 13.7 % (ref 11.5–15.5)
WBC: 18.3 10*3/uL — ABNORMAL HIGH (ref 4.0–10.5)
nRBC: 0 % (ref 0.0–0.2)

## 2021-10-11 LAB — FERRITIN: Ferritin: 16 ng/mL (ref 11–307)

## 2021-10-11 LAB — LIPID PANEL
Cholesterol: 171 mg/dL (ref 0–200)
HDL: 45 mg/dL (ref >40)
LDL Cholesterol: 118 mg/dL — ABNORMAL HIGH (ref 0–99)
Total CHOL/HDL Ratio: 3.8 RATIO
Triglycerides: 40 mg/dL (ref <150)
VLDL: 8 mg/dL (ref 0–40)

## 2021-10-11 LAB — IRON AND TIBC
Iron: 48 ug/dL (ref 28–170)
Saturation Ratios: 13 % (ref 10.4–31.8)
TIBC: 361 ug/dL (ref 250–450)
UIBC: 313 ug/dL

## 2021-10-11 MED ORDER — INSULIN GLARGINE-YFGN 100 UNIT/ML ~~LOC~~ SOLN
3.0000 [IU] | Freq: Every day | SUBCUTANEOUS | Status: DC
  Administered 2021-10-11 – 2021-10-13 (×3): 3 [IU] via SUBCUTANEOUS
  Filled 2021-10-11 (×5): qty 0.03

## 2021-10-11 NOTE — Progress Notes (Addendum)
[place] Daily Student Progress Note  HD#1 Subjective:  Shelley Simon is a 21 year old female with history of severe obesity and depression/anxiety presenting with progressive neuropathy and weakness admitted for new-onset multiple sclerosis.   Overnight Events: Had a soft BP this AM around 05:00. Recheck at 06:30 when awakened was 107/64  Patient seen and assessed at bedside. Father noticed last night sudden stretching of patient's arm last night. Patient reports some improvement in tremors. Reports arm is more tingling and tight which is an improvement from the numbness she was feeling yesterday. Reports appropriate PO intake, denies UOP.   Objective:  Vital signs in last 24 hours: Vitals:   10/11/21 0127 10/11/21 0542 10/11/21 0634 10/11/21 0913  BP: (!) 104/54 (!) 82/47 107/64 123/72  Pulse: 81 95  (!) 110  Resp: 18 18  20   Temp: 98.1 F (36.7 C) 98 F (36.7 C)  98.5 F (36.9 C)  TempSrc: Oral Oral    SpO2: 97% 98%  97%  Weight:      Height:       Supplemental O2: Room Air SpO2: 97 %   Physical Exam:  Physical Exam  PULM: CTAB CV: Regular rhythm, no m/r/g  DERM: Red coloration to the cheeks  NEURO: Significant dysmetria with FTN bilaterally, improved from yesterday. Endorses some diploplia lateral gaze testing. Strength RUE 4/5, LUE 5/5, BLE 5/5 Sensation intact and equal left versus right in face and all extremities except right hand with less sensation than left.  Filed Weights   10/09/21 1618 10/10/21 0036  Weight: 117.9 kg 127 kg     Intake/Output Summary (Last 24 hours) at 10/11/2021 1256 Last data filed at 10/10/2021 2324 Gross per 24 hour  Intake 840 ml  Output --  Net 840 ml   Net IO Since Admission: 1,255.59 mL [10/11/21 1256]  Pertinent Labs:    Latest Ref Rng & Units 10/11/2021    8:16 AM 10/10/2021    4:50 AM 10/09/2021    4:35 PM  CBC  WBC 4.0 - 10.5 K/uL 18.3   7.5   9.5    Hemoglobin 12.0 - 15.0 g/dL 12.7   11.6   13.1     Hematocrit 36.0 - 46.0 % 39.2   35.5   40.6    Platelets 150 - 400 K/uL 425   355   432         Latest Ref Rng & Units 10/11/2021    8:16 AM 10/10/2021    4:50 AM 10/09/2021    4:35 PM  CMP  Glucose 70 - 99 mg/dL 177   94   94    BUN 6 - 20 mg/dL 13   11   12     Creatinine 0.44 - 1.00 mg/dL 0.85   0.81   0.88    Sodium 135 - 145 mmol/L 134   136   138    Potassium 3.5 - 5.1 mmol/L 3.6   3.7   3.9    Chloride 98 - 111 mmol/L 102   108   105    CO2 22 - 32 mmol/L 21   23   25     Calcium 8.9 - 10.3 mg/dL 8.8   8.5   8.8    Total Protein 6.5 - 8.1 g/dL   7.7    Total Bilirubin 0.3 - 1.2 mg/dL   0.5    Alkaline Phos 38 - 126 U/L   99    AST 15 -  41 U/L   15    ALT 0 - 44 U/L   13      Imaging: No results found.  Assessment/Plan:   Principal Problem:   Multiple sclerosis ([place])   Patient Summary: Shelley Simon is a 20 y.o. with a pertinent PMH of depression/anxiety and obesity, who presented with 5 day history of extremity weakness/numbness and lower back pain and admitted for new onset multiple sclerosis.  #Multiple Sclerosis Flare #Remitting and Relapsing Multiple Sclerosis  -Neurology following, appreciate recommendations -IV Solumedrol 1g x 5 days, finish 5/30 -Start 3U glargine -SSI to manage hyperglycemia secondary to solumedrol  #Hypotension  Sinus tachycardia: Orthostatic vitals paradoxically showed decreased HR and increased BP on standing. Hypovolemia seems unlikely with adequate urine output. Afebrile. TSH wnl. Dysautonomia may be a factor secondary to demyelinating process.  -Continue to monitor  Depression/Anxiety Secondary to MS Cymbalta 60 mg qhs  Diet: Normal IVF: None,None VTE: Xarelto Code: Full PT/OT recs: Pending, none. TOC recs: Home   Dispo: Anticipated discharge to Home on 10/14/21 pending completion of steroid course and control of symptoms.   Vertell Novak, Medical Student 10/11/2021, 12:56 PM Pager: [phone]  Please  contact the on call pager after 5 pm and on weekends at [phone].  Attestation for Student Documentation:  I personally was present and performed or re-performed the history, physical exam and medical decision-making activities of this service and have verified that the service and findings are accurately documented in the student's note.  France Ravens, MD 10/11/2021, 4:31 PM

## 2021-10-11 NOTE — Progress Notes (Signed)
Spoke with Dr., Hazle Quant it is ok for pt family to wheel her downstairs and it is ok for PT to take patient outside during their session.

## 2021-10-11 NOTE — Progress Notes (Signed)
Occupational Therapy Treatment Patient Details Name: Shelley Simon MRN: [id number] DOB: 05-Nov-2000 Today's Date: 10/11/2021   History of present illness 21 y/o female presented to ED on 10/09/21 for c/o lower back pain, weakness, and bilateral UE and LE numbness. Imaging consistent with diagnosis of multiple sclerosis. PMH: depression/anxiety   OT comments  Focus of session on educating pt in home exercise program with med soft theraputty and level 2 theraband. Family thinks they have a shower seat already at home. Began educating pt in energy conservation strategies.    Recommendations for follow up therapy are one component of a multi-disciplinary discharge planning process, led by the attending physician.  Recommendations may be updated based on patient status, additional functional criteria and insurance authorization.    Follow Up Recommendations  Outpatient OT    Assistance Recommended at Discharge Intermittent Supervision/Assistance  Patient can return home with the following  Assistance with cooking/housework;A little help with bathing/dressing/bathroom;Help with stairs or ramp for entrance;Assist for transportation   Equipment Recommendations  None recommended by OT    Recommendations for Other Services      Precautions / Restrictions Precautions Precautions: Fall Restrictions Weight Bearing Restrictions: No       Mobility Bed Mobility Overal bed mobility: Modified Independent                  Transfers                         Balance                                           ADL either performed or assessed with clinical judgement   ADL                                         General ADL Comments: pt has a tub seat left from a deceased family member, began educating pt in energy conservation strategies.    Extremity/Trunk Assessment              Vision       Perception     Praxis       Cognition Arousal/Alertness: Awake/alert Behavior During Therapy: WFL for tasks assessed/performed                                   General Comments: Pt reports needing to repeatedly read passages before being able to comprehend them and some shor term memory deficits. Pt is considering taking the summer off from school while she recovers.        Exercises Exercises: General Upper Extremity, Other exercises General Exercises - Upper Extremity Shoulder Flexion: Strengthening, Both, 10 reps, Seated, Theraband Theraband Level (Shoulder Flexion): Level 2 (Red) Shoulder Horizontal ABduction: Strengthening, Both, 10 reps, Seated, Theraband Theraband Level (Shoulder Horizontal Abduction): Level 2 (Red) Elbow Flexion: Strengthening, Both, 10 reps, Seated, Theraband Theraband Level (Elbow Flexion): Level 2 (Red) Elbow Extension: Strengthening, Both, 10 reps, Seated, Theraband Theraband Level (Elbow Extension): Level 2 (Red) Other Exercises Other Exercises: instructed in medium-soft theraputty exercises for B hands    Shoulder Instructions       General Comments      Pertinent Vitals/  Pain       Pain Assessment Pain Assessment: Faces Faces Pain Scale: Hurts little more Pain Location: hands and feet Pain Descriptors / Indicators: Numbness Pain Intervention(s): Monitored during session  Home Living                                          Prior Functioning/Environment              Frequency  Min 2X/week        Progress Toward Goals  OT Goals(current goals can now be found in the care plan section)  Progress towards OT goals: Progressing toward goals  Acute Rehab OT Goals OT Goal Formulation: With patient Time For Goal Achievement: 10/24/21 Potential to Achieve Goals: Good  Plan Discharge plan remains appropriate    Co-evaluation                 AM-PAC OT "6 Clicks" Daily Activity     Outcome Measure   Help from  another person eating meals?: None Help from another person taking care of personal grooming?: A Little Help from another person toileting, which includes using toliet, bedpan, or urinal?: A Little Help from another person bathing (including washing, rinsing, drying)?: A Little Help from another person to put on and taking off regular upper body clothing?: None Help from another person to put on and taking off regular lower body clothing?: A Little 6 Click Score: 20    End of Session    OT Visit Diagnosis: Other abnormalities of gait and mobility (R26.89);Muscle weakness (generalized) (M62.81)   Activity Tolerance Patient tolerated treatment well   Patient Left in bed;with call bell/phone within reach;with family/visitor present   Nurse Communication          Time: 9604-5409 OT Time Calculation (min): 32 min  Charges: OT General Charges $OT Visit: 1 Visit OT Treatments $Self Care/Home Management : 8-22 mins $Therapeutic Exercise: 8-22 mins  Martie Round, OTR/L Acute Rehabilitation Services Pager: [phone] Office: [phone]   Evern Bio 10/11/2021, 1:11 PM

## 2021-10-11 NOTE — Progress Notes (Signed)
Physical Therapy Treatment Patient Details Name: Shelley Simon MRN: [id number] DOB: 30-Mar-2001 Today's Date: 10/11/2021   History of Present Illness 21 y/o female presented to ED on 10/09/21 for c/o lower back pain, weakness, and bilateral UE and LE numbness. Imaging consistent with diagnosis of multiple sclerosis. PMH: depression/anxiety    PT Comments    Patient continues to progress towards physical therapy goals. Patient reports improving sensation in R hand but continues to have difficulty with weakness and coordination. Ambulated in hallway with RW and min guard (with & without physical contact). Instructed patient on exercises to perform in the room focused on LE strengthening. Extensive conversation with patient focused on encouragement and diagnosis due to patient currently being emotionally labile at this time with new diagnosis. D/c plan remains appropriate.    Recommendations for follow up therapy are one component of a multi-disciplinary discharge planning process, led by the attending physician.  Recommendations may be updated based on patient status, additional functional criteria and insurance authorization.  Follow Up Recommendations  Outpatient PT     Assistance Recommended at Discharge Intermittent Supervision/Assistance  Patient can return home with the following A little help with bathing/dressing/bathroom;A little help with walking and/or transfers;Assistance with cooking/housework;Assist for transportation;Help with stairs or ramp for entrance   Equipment Recommendations  Rolling Kadeidra Coryell (2 wheels)    Recommendations for Other Services       Precautions / Restrictions Precautions Precautions: Fall Restrictions Weight Bearing Restrictions: No     Mobility  Bed Mobility Overal bed mobility: Modified Independent                  Transfers Overall transfer level: Needs assistance Equipment used: Rolling Lakhia Gengler (2 wheels) Transfers: Sit to/from  Stand Sit to Stand: Supervision           General transfer comment: supervision for safety    Ambulation/Gait Ambulation/Gait assistance: Min guard Gait Distance (Feet): 120 Feet Assistive device: Rolling Rosealie Reach (2 wheels) Gait Pattern/deviations: Step-through pattern, Decreased stride length, Ataxic Gait velocity: decreased     General Gait Details: min guard for safety (with/without assistance). Continues to demo mild ataxia due to sensation deficits. Cues for utilizing vision improves ataxia. At times, narrow BOS but with cues it improves   Stairs             Wheelchair Mobility    Modified Rankin (Stroke Patients Only)       Balance                                            Cognition Arousal/Alertness: Awake/alert Behavior During Therapy: WFL for tasks assessed/performed Overall Cognitive Status: Within Functional Limits for tasks assessed                                 General Comments: reports slow processing and needing more time to think about familiar tasks but seems to be improving        Exercises Other Exercises Other Exercises: Instructed on mini squats, lunges, standing hip flexion/marching, abduction while in room    General Comments General comments (skin integrity, edema, etc.): Extensive conversation focused on encouragement as patient is emotionally labile at this time due to recent diagnosis      Pertinent Vitals/Pain Pain Assessment Pain Assessment: Faces Faces Pain Scale: Hurts little more  Pain Location: hands and feet Pain Descriptors / Indicators: Numbness Pain Intervention(s): Monitored during session    Home Living                          Prior Function            PT Goals (current goals can now be found in the care plan section) Acute Rehab PT Goals PT Goal Formulation: With patient Time For Goal Achievement: 10/24/21 Potential to Achieve Goals: Good Progress towards  PT goals: Progressing toward goals    Frequency    Min 3X/week      PT Plan Current plan remains appropriate    Co-evaluation              AM-PAC PT "6 Clicks" Mobility   Outcome Measure  Help needed turning from your back to your side while in a flat bed without using bedrails?: None Help needed moving from lying on your back to sitting on the side of a flat bed without using bedrails?: None Help needed moving to and from a bed to a chair (including a wheelchair)?: A Little Help needed standing up from a chair using your arms (e.g., wheelchair or bedside chair)?: A Little Help needed to walk in hospital room?: A Little Help needed climbing 3-5 steps with a railing? : A Little 6 Click Score: 20    End of Session   Activity Tolerance: Patient tolerated treatment well Patient left: in bed;with call bell/phone within reach;with family/visitor present Nurse Communication: Mobility status PT Visit Diagnosis: Unsteadiness on feet (R26.81);Muscle weakness (generalized) (M62.81);Ataxic gait (R26.0);Other abnormalities of gait and mobility (R26.89);History of falling (Z91.81)     Time: [id number] PT Time Calculation (min) (ACUTE ONLY): 55 min  Charges:  $Gait Training: 23-37 mins $Therapeutic Activity: 23-37 mins                     Osmani Kersten A. Gilford Rile PT, DPT Acute Rehabilitation Services Pager [phone] Office [phone]    Linna Hoff 10/11/2021, 4:59 PM

## 2021-10-12 LAB — GLUCOSE, CAPILLARY
Glucose-Capillary: 107 mg/dL — ABNORMAL HIGH (ref 70–99)
Glucose-Capillary: 158 mg/dL — ABNORMAL HIGH (ref 70–99)
Glucose-Capillary: 141 mg/dL — ABNORMAL HIGH (ref 70–99)
Glucose-Capillary: 198 mg/dL — ABNORMAL HIGH (ref 70–99)

## 2021-10-12 NOTE — Hospital Course (Signed)
Shelley Simon is a 21 y.o.female with a history of obesity, depression, anxiety who was admitted to [facility] for acute multiple sclerosis flare. Hospital course is detailed below:  Acute MS flare Newly diagnosed multiple sclerosis Patient on presentation had right extremity weakness/numbness and lower back pain.  On MRI, there are multiple hyperintense T2 foci in periventricular white matter, cervical spinal cord as well as possibly in T3-T4.  Given these findings and gait difficulties as well as numbness, neurology was consulted and recommended starting Solu-Medrol 1 g IV daily for 5-day course and monitor symptoms.  Patient's neurological symptoms began to improve throughout hospitalization.  On discharge, neurology recommended***.  Other chronic conditions were medically managed with home medications and formulary alternatives as necessary (Depression)  PCP Follow-up Recommendations:

## 2021-10-12 NOTE — Progress Notes (Addendum)
[place] Daily Resident Progress Note  HD#2 Subjective:  Shelley Simon is a 21 year old female with history of severe obesity and depression/anxiety presenting with progressive neuropathy and weakness admitted for new-onset multiple sclerosis.   Overnight Events: NAEON  Patient seen and assessed at bedside. Working on improving fine motor and reports less tremors and numbness in right extremity.  Objective:  Vital signs in last 24 hours: Vitals:   10/11/21 0634 10/11/21 0913 10/11/21 2041 10/12/21 0502  BP: 107/64 123/72 122/65 (!) 99/55  Pulse:  (!) 110 90 78  Resp:  20 18 18   Temp:  98.5 F (36.9 C) 98.3 F (36.8 C) 98.1 F (36.7 C)  TempSrc:   Oral Oral  SpO2:  97% 95% 100%  Weight:      Height:       Supplemental O2: Room Air SpO2: 100 %   Physical Exam:  Physical Exam Constitutional:      General: She is not in acute distress.    Appearance: She is obese. She is not ill-appearing or diaphoretic.  HENT:     Head: Normocephalic and atraumatic.  Cardiovascular:     Rate and Rhythm: Normal rate and regular rhythm.  Abdominal:     General: Bowel sounds are normal.     Palpations: Abdomen is soft.  Skin:    General: Skin is warm and dry.  Neurological:     Mental Status: She is alert and oriented to person, place, and time. Mental status is at baseline.     Cranial Nerves: Cranial nerves 2-12 are intact. No dysarthria.     Sensory: Sensory deficit present.     Motor: Weakness present. No tremor, atrophy, abnormal muscle tone or seizure activity.     Coordination: Finger-Nose-Finger Test abnormal.     Comments: Minor diplopia with rightward and leftward gaze Numbness and tingling in RUE compared to LUE. Similar in RLE vs LLE.  Strength: 4/5 RUE, 5/5 all other extremities   Psychiatric:        Mood and Affect: Mood normal.        Behavior: Behavior normal.    Filed Weights   10/09/21 1618 10/10/21 0036  Weight: 117.9 kg 127 kg     Intake/Output  Summary (Last 24 hours) at 10/12/2021 0925 Last data filed at 10/12/2021 0841 Gross per 24 hour  Intake 737 ml  Output 0 ml  Net 737 ml    Net IO Since Admission: 2,232.59 mL [10/12/21 0925]  Pertinent Labs:    Latest Ref Rng & Units 10/11/2021    8:16 AM 10/10/2021    4:50 AM 10/09/2021    4:35 PM  CBC  WBC 4.0 - 10.5 K/uL 18.3   7.5   9.5    Hemoglobin 12.0 - 15.0 g/dL 10/11/2021   17.4   08.1    Hematocrit 36.0 - 46.0 % 39.2   35.5   40.6    Platelets 150 - 400 K/uL 425   355   432         Latest Ref Rng & Units 10/11/2021    8:16 AM 10/10/2021    4:50 AM 10/09/2021    4:35 PM  CMP  Glucose 70 - 99 mg/dL 10/11/2021   94   94    BUN 6 - 20 mg/dL 13   11   12     Creatinine 0.44 - 1.00 mg/dL 185     6.31    Sodium 135 - 145 mmol/L  134   136   138    Potassium 3.5 - 5.1 mmol/L 3.6   3.7   3.9    Chloride 98 - 111 mmol/L 102   108   105    CO2 22 - 32 mmol/L 21   23   25     Calcium 8.9 - 10.3 mg/dL 8.8   8.5   8.8    Total Protein 6.5 - 8.1 g/dL   7.7    Total Bilirubin 0.3 - 1.2 mg/dL   0.5    Alkaline Phos 38 - 126 U/L   99    AST 15 - 41 U/L   15    ALT 0 - 44 U/L   13      Imaging: No results found.  Assessment/Plan:   Principal Problem:   Multiple sclerosis (HCC)   Patient Summary: Shelley Simon is a 21 y.o. with a pertinent PMH of depression/anxiety and obesity, who presented with 5 day history of extremity weakness/numbness and lower back pain and admitted for new onset multiple sclerosis.  #Multiple Sclerosis Flare #Remitting and Relapsing Multiple Sclerosis  -Neurology following, appreciate recommendations -IV Solumedrol 1g x 5 days, finish 5/30 -Continue 3U glargine -SSI to manage hyperglycemia secondary to solumedrol -Outpatient neurology follow up  #Hypotension  Sinus tachycardia: Orthostatic vitals paradoxically showed decreased HR and increased BP on standing. Hypovolemia seems unlikely with adequate urine output. Afebrile. TSH wnl. Dysautonomia may be  a factor secondary to demyelinating process.  -Continue to monitor  Depression/Anxiety Secondary to MS Cymbalta 60 mg qhs  Diet: Normal IVF: None,None VTE: Xarelto Code: Full PT/OT recs: Pending, none. TOC recs: Home   Dispo: Anticipated discharge to Home on 10/14/21 pending completion of steroid course and control of symptoms.   10/16/21, MD 10/12/2021, 9:25 AM Pager: [phone]  Please contact the on call pager after 5 pm and on weekends at [phone].

## 2021-10-12 NOTE — Progress Notes (Signed)
Neurology Progress Note  Brief HPI: Shelley Simon a 21 year old female with history of severe obesity and depression/anxiety presenting with progressive neuropathy and weakness admitted for new-onset multiple sclerosis.   Subjective: No overnight events. Patient was seen and examined during rounds this AM with grandfather at bedside.  She Simon alert, awake, pleasant and cooperative.  She reports improvement in her fine motor and normal tremors.  Still having some numbness in her right lower extremity with it has improved she reported. Still requires assistant program provided to use the bathroom since she still has some unsteady gait. Exam: Vitals:   10/12/21 0502 10/12/21 0943  BP: (!) 99/55 125/78  Pulse: 78 96  Resp: 18 18  Temp: 98.1 F (36.7 C) 98.8 F (37.1 C)  SpO2: 100% 97%    General Physical Examination  General:  No acute distress HEENT: Normocephalic, Atraumatic Neck:  Supple  Lungs:  No respiratory distress Abdomen:  Non-tender, Non-distended  Skin:  Warm and dry    Extremities:  Full ROM  Psychiatry: Appropriate mood and affect  Neuro: Brief Neurologic Examination  Mental Status:  Alert Oriented to person, place, and time Follows 2 step commands Attention and concentration are normal Speech Simon normal; fluent   Cranial Nerves:  EOMI; PEERL Facial expression Simon full and symmetric Facial sensation intact to soft touch Hearing grossly intact Tongue protrudes midline   Motor Exam:  5/5x4 Normal bulk and tone No abnormal movements observed    Sensory Exam:  Intact to light touch in all extremities  Coordination:  Finger to nose normal bit slower on the right  Reflexes:  Plantar response- Left and Right downgoing   Gait:  Deferred    Infusions:  Medications:   DULoxetine  60 mg Oral QHS   insulin aspart  0-20 Units Subcutaneous TID WC   insulin glargine-yfgn  3 Units Subcutaneous QHS   rivaroxaban  10 mg Oral Daily   PRN  medications  Review of Systems: Except for pertinent positives listed in the HPI, all other systems were reviewed and are negative  Results Labs:  Results for orders placed or performed during the hospital encounter of 10/09/21 (from the past 24 hour(s))  Glucose, capillary   Collection Time: 10/11/21  4:34 PM  Result Value Ref Range   Glucose-Capillary 150 (H) 70 - 99 mg/dL  Glucose, capillary   Collection Time: 10/11/21  8:42 PM  Result Value Ref Range   Glucose-Capillary 199 (H) 70 - 99 mg/dL  Glucose, capillary   Collection Time: 10/12/21  7:18 AM  Result Value Ref Range   Glucose-Capillary 107 (H) 70 - 99 mg/dL  Glucose, capillary   Collection Time: 10/12/21 11:32 AM  Result Value Ref Range   Glucose-Capillary 158 (H) 70 - 99 mg/dL     Imaging Reviewed: MRI brain with without contrast done on 10/09/2021   IMPRESSION: 1. Radially oriented T2 hyperintense foci in the periventricular white matter, which are concerning for multiple sclerosis. No enhancing lesions to suggest active demyelination. 2. No additional acute intracranial process. No evidence of acute or subacute infarct.  MRI cervical spine/thoracic spine with without contrast done on 10/09/2021 IMPRESSION: 1. Areas of increased T2 signal in the cervical spinal cord, with 1 possible cervical lesion extending approximately 2-3 vertebral bodies in craniocaudal dimension. Additional smaller T2 hyperintense foci in the spinal cord at C4 and C6 are also seen. These remain concerning for multiple sclerosis. No abnormal enhancement to suggest active demyelination. 2. Evaluation of  the thoracic spinal cord Simon somewhat limited by motion artifact. There Simon possible T2 hyperintense lesion at T3-T4, although this may be artifactual. No abnormal enhancement to suggest active demyelination. 3. No spinal canal stenosis or neural foraminal narrowing.      Impression: 21 year old female who presented with gait  instability, weakness in bilateral upper extremities, with 5 years of occasional difficulty seeing, as well as episode of numbness, difficulty using her hands especially her right hand as well as worsening numbness and having difficulty comprehending after reading months Imaging of the brain, cervical and thoracic spine Simon consistent with a diagnosis of multiple sclerosis.  Given her clinical history she meets criteria for dissemination in time and imaging supports dissemination in space.  Furthermore on personal review Dr Curly Shores do feel that there Simon slight contrast-enhancement in the largest cervical spine lesion, and this would certainly fit her current symptoms.  Given the confusion reported Simon fairly mild, neurology do not feel that there are any atypical features here. Regarding her lumbar pain, I think this may be secondary to gait instability and weakness from multiple sclerosis.  Given her Patient has reported some improvement since she was started on Solu-Medrol     Recommendations: 1) we will continue on Solu-Medrol 1 g IV daily x5 days.  The last dose will be 5/30. -we will recommend PT/OT evaluation -Blood pressure and blood glucose monitoring (recommend sliding scale insulin with meals but not nightly given blood glucoses rise from steroids Simon typically prandial) -Continue home Cymbalta -Neurology will continue to follow  Pt seen by NP/Neuro and later by MD. Note/plan to be edited by MD as needed.  Joelyn Oms, NP-BC 10/12/2021 / 2:51 PM  [facility] [phone]

## 2021-10-12 NOTE — Progress Notes (Signed)
PT Cancellation Note  Patient Details Name: Shelley Simon MRN: [id number] DOB: 04-03-2001   Cancelled Treatment:    Reason Eval/Treat Not Completed: Other (comment)  Pt was off the floor with her family (and PT heartily endorses time with family and outside her vERY small room!)  Will continue to follow,   Van Clines, PT  Acute Rehabilitation Services Office [phone]    [name] 10/12/2021, 12:56 PM

## 2021-10-13 LAB — BASIC METABOLIC PANEL
Anion gap: 5 (ref 5–15)
BUN: 14 mg/dL (ref 6–20)
CO2: 29 mmol/L (ref 22–32)
Calcium: 8.6 mg/dL — ABNORMAL LOW (ref 8.9–10.3)
Chloride: 106 mmol/L (ref 98–111)
Creatinine, Ser: 0.78 mg/dL (ref 0.44–1.00)
GFR, Estimated: >60 mL/min (ref >60)
Glucose, Bld: 141 mg/dL — ABNORMAL HIGH (ref 70–99)
Potassium: 3.5 mmol/L (ref 3.5–5.1)
Sodium: 140 mmol/L (ref 135–145)

## 2021-10-13 LAB — GLUCOSE, CAPILLARY
Glucose-Capillary: 112 mg/dL — ABNORMAL HIGH (ref 70–99)
Glucose-Capillary: 193 mg/dL — ABNORMAL HIGH (ref 70–99)
Glucose-Capillary: 145 mg/dL — ABNORMAL HIGH (ref 70–99)
Glucose-Capillary: 168 mg/dL — ABNORMAL HIGH (ref 70–99)

## 2021-10-13 LAB — CBC
HCT: 35 % — ABNORMAL LOW (ref 36.0–46.0)
Hemoglobin: 11.4 g/dL — ABNORMAL LOW (ref 12.0–15.0)
MCH: 28.6 pg (ref 26.0–34.0)
MCHC: 32.6 g/dL (ref 30.0–36.0)
MCV: 87.9 fL (ref 80.0–100.0)
Platelets: 385 10*3/uL (ref 150–400)
RBC: 3.98 MIL/uL (ref 3.87–5.11)
RDW: 14 % (ref 11.5–15.5)
WBC: 10.7 10*3/uL — ABNORMAL HIGH (ref 4.0–10.5)
nRBC: 0 % (ref 0.0–0.2)

## 2021-10-13 MED ORDER — SODIUM CHLORIDE 0.9 % IV SOLN
250.0000 mg | Freq: Every day | INTRAVENOUS | Status: AC
  Administered 2021-10-13 – 2021-10-14 (×2): 250 mg via INTRAVENOUS
  Filled 2021-10-13 (×2): qty 20

## 2021-10-13 NOTE — Progress Notes (Signed)
OT Cancellation Note  Patient Details Name: Shelley Simon MRN: [id number] DOB: Oct 06, 2000   Cancelled Treatment:    Reason Eval/Treat Not Completed:  (Attempted x 2, pt out of room with family or working with another discipline.)  Evern Bio 10/13/2021, 4:05 PM Martie Round, OTR/L Acute Rehabilitation Services Pager: [phone] Office: [phone]

## 2021-10-13 NOTE — Progress Notes (Signed)
[place] Daily Resident Progress Note  HD#3 Subjective:  Shelley Simon is a 21 year old female with history of severe obesity and depression/anxiety presenting with progressive neuropathy and weakness admitted for new-onset multiple sclerosis.   Overnight Events: NAEON  Patient was seen at bedside this AM with her mother present. The pt states that her numbness/tingling and dysmetria have all been improving since starting steroids. States that she felt like her heart may have been racing overnight but denies any symptoms currently and states that this has resolved. No other complaints or concerns at this time.  Objective:  Vital signs in last 24 hours: Vitals:   10/12/21 0943 10/12/21 1635 10/12/21 2104 10/13/21 0448  BP: 125/78 122/72 116/63 115/72  Pulse: 96 67 87 63  Resp: 18 16 18 17   Temp: 98.8 F (37.1 C) 97.7 F (36.5 C) 99.1 F (37.3 C) 98.2 F (36.8 C)  TempSrc: Oral Oral Oral   SpO2: 97% 99% 97% 98%  Weight:      Height:       Supplemental O2: Room Air SpO2: 98 %   Physical Exam:  Physical Exam Constitutional:      General: She is not in acute distress.    Appearance: She is obese. She is not ill-appearing or diaphoretic.  HENT:     Head: Normocephalic and atraumatic.  Cardiovascular:     Rate and Rhythm: Normal rate and regular rhythm.  Abdominal:     General: Bowel sounds are normal.     Palpations: Abdomen is soft.  Skin:    General: Skin is warm and dry.  Neurological:     Mental Status: She is alert and oriented to person, place, and time. Mental status is at baseline.     Cranial Nerves: Cranial nerves 2-12 are intact. No dysarthria.     Sensory: Sensory deficit present.     Motor: Weakness present. No tremor, atrophy, abnormal muscle tone or seizure activity.     Coordination: Finger-Nose-Finger Test abnormal.     Comments: Numbness and tingling in RUE compared to LUE.  Strength: 4+/5 RUE, 5/5 all other extremities   Psychiatric:         Mood and Affect: Mood normal.        Behavior: Behavior normal.    Filed Weights   10/09/21 1618 10/10/21 0036  Weight: 117.9 kg 127 kg     Intake/Output Summary (Last 24 hours) at 10/13/2021 0746 Last data filed at 10/12/2021 2104 Gross per 24 hour  Intake 880 ml  Output 0 ml  Net 880 ml    Net IO Since Admission: 2,672.59 mL [10/13/21 0746]  Pertinent Labs:    Latest Ref Rng & Units 10/13/2021    1:54 AM 10/11/2021    8:16 AM 10/10/2021    4:50 AM  CBC  WBC 4.0 - 10.5 K/uL 10.7   18.3   7.5    Hemoglobin 12.0 - 15.0 g/dL 10/12/2021   08.1   44.8    Hematocrit 36.0 - 46.0 % 35.0   39.2   35.5    Platelets 150 - 400 K/uL 385   425   355         Latest Ref Rng & Units 10/13/2021    1:54 AM 10/11/2021    8:16 AM 10/10/2021    4:50 AM  CMP  Glucose 70 - 99 mg/dL 10/12/2021   631   94    BUN 6 - 20 mg/dL 14   13  11    Creatinine 0.44 - 1.00 mg/dL 8.12   7.51   7.00    Sodium 135 - 145 mmol/L 140   134   136    Potassium 3.5 - 5.1 mmol/L 3.5   3.6   3.7    Chloride 98 - 111 mmol/L 106   102   108    CO2 22 - 32 mmol/L 29   21   23     Calcium 8.9 - 10.3 mg/dL 8.6   8.8   8.5      Imaging: No results found.  Assessment/Plan:   Principal Problem:   Multiple sclerosis (HCC)   Patient Summary: Shelley Simon is a 21 y.o. with a pertinent PMH of depression/anxiety and obesity, who presented with 5 day history of extremity weakness/numbness and lower back pain and admitted for new onset multiple sclerosis, improving on steroid regimen.  #Multiple Sclerosis Flare #Remitting and Relapsing Multiple Sclerosis  - Neurology following, appreciate recommendations - IV Solumedrol 1g x 5 days, finish 5/30 - PT recs: pending - Continue 3U glargine - SSI to manage hyperglycemia secondary to solumedrol - Outpatient neurology follow up  #Hypotension  Sinus tachycardia: Orthostatic vitals paradoxically showed decreased HR and increased BP on standing. Hypovolemia seems unlikely with  adequate urine output. Afebrile. TSH wnl. Dysautonomia may be a factor secondary to demyelinating process.  - Continue to monitor  Depression/Anxiety - Continue Cymbalta 60 mg qhs  Diet: Normal IVF: None,None VTE: Xarelto Code: Full PT/OT recs: Pending, none. TOC recs: Home   Dispo: Anticipated discharge to Home on 10/14/21 pending completion of steroid course and control of symptoms.   10/16/21, MD 10/13/2021, 7:46 AM Pager: [phone]  Please contact the on call pager after 5 pm and on weekends at [phone].

## 2021-10-13 NOTE — Progress Notes (Signed)
Physical Therapy Treatment Patient Details Name: Shelley Simon MRN: [id number] DOB: Sep 17, 2000 Today's Date: 10/13/2021   History of Present Illness 21 y/o female presented to ED on 10/09/21 for c/o lower back pain, weakness, and bilateral UE and LE numbness. Imaging consistent with diagnosis of multiple sclerosis. PMH: depression/anxiety    PT Comments    Continuing work on functional mobility and activity tolerance;  Session focused on LE coordination with stepping and LE stability in stance, two big components of gait, with good progress; excellent participation; Family present and engaged in activity; On track for dc home tomorrow  Recommendations for follow up therapy are one component of a multi-disciplinary discharge planning process, led by the attending physician.  Recommendations may be updated based on patient status, additional functional criteria and insurance authorization.  Follow Up Recommendations  Outpatient PT     Assistance Recommended at Discharge Intermittent Supervision/Assistance  Patient can return home with the following A little help with bathing/dressing/bathroom;A little help with walking and/or transfers;Assistance with cooking/housework;Assist for transportation;Help with stairs or ramp for entrance   Equipment Recommendations  Rolling walker (2 wheels)    Recommendations for Other Services       Precautions / Restrictions Precautions Precautions: Fall Precaution Comments: Fall risk is present, but minimized with use of RW Restrictions Weight Bearing Restrictions: No     Mobility  Bed Mobility                    Transfers Overall transfer level: Needs assistance Equipment used: Rolling walker (2 wheels) Transfers: Sit to/from Stand Sit to Stand: Supervision           General transfer comment: supervision for safety    Ambulation/Gait Ambulation/Gait assistance: Min guard (with and without physical contact ) Gait Distance  (Feet): 120 Feet Assistive device: Rolling walker (2 wheels) Gait Pattern/deviations: Step-through pattern, Decreased stride length, Ataxic       General Gait Details: min guard for safety (with/without assistance). Focused on stability in stance R and LLEs; Continues to demo mild ataxia due to sensation deficits. BOS improved   Stairs         General stair comments: Simulated stair climb with repeated step-up therex   Wheelchair Mobility    Modified Rankin (Stroke Patients Only)       Balance Overall balance assessment: Mild deficits observed, not formally tested, History of Falls (tentative when ambulating, more sure-footed with RW)                                          Cognition Arousal/Alertness: Awake/alert Behavior During Therapy: WFL for tasks assessed/performed Overall Cognitive Status: Within Functional Limits for tasks assessed                                 General Comments: Continuing improvements        Exercises Other Exercises Other Exercises: step-touches to work on coordination (moving leg) and stability in stance leg; 10 reps each side Other Exercises: Step ups for strengthening R and LLEs; 10 reps each    General Comments General comments (skin integrity, edema, etc.): Parents present and supportive; Continued encouragement given      Pertinent Vitals/Pain Pain Assessment Pain Assessment: Faces Faces Pain Scale: Hurts a little bit Pain Location: hands and feet Pain Descriptors / Indicators:  Numbness Pain Intervention(s): Monitored during session    Home Living                          Prior Function            PT Goals (current goals can now be found in the care plan section) Acute Rehab PT Goals Patient Stated Goal: to get better; hopeful for getting home tomorrow PT Goal Formulation: With patient Time For Goal Achievement: 10/24/21 Potential to Achieve Goals: Good Progress towards PT  goals: Progressing toward goals    Frequency    Min 3X/week      PT Plan Current plan remains appropriate    Co-evaluation              AM-PAC PT "6 Clicks" Mobility   Outcome Measure  Help needed turning from your back to your side while in a flat bed without using bedrails?: None Help needed moving from lying on your back to sitting on the side of a flat bed without using bedrails?: None Help needed moving to and from a bed to a chair (including a wheelchair)?: A Little Help needed standing up from a chair using your arms (e.g., wheelchair or bedside chair)?: A Little Help needed to walk in hospital room?: A Little Help needed climbing 3-5 steps with a railing? : A Little 6 Click Score: 20    End of Session   Activity Tolerance: Patient tolerated treatment well Patient left: in bed;with family/visitor present (sitting EOB) Nurse Communication: Mobility status PT Visit Diagnosis: Unsteadiness on feet (R26.81);Muscle weakness (generalized) (M62.81);Ataxic gait (R26.0);Other abnormalities of gait and mobility (R26.89);History of falling (Z91.81)     Time: 3790-2409 PT Time Calculation (min) (ACUTE ONLY): 41 min  Charges:  $Gait Training: 8-22 mins $Therapeutic Exercise: 8-22 mins $Neuromuscular Re-education: 8-22 mins                     Van Clines, PT  Acute Rehabilitation Services Office [phone]    [name] 10/13/2021, 11:18 AM

## 2021-10-14 ENCOUNTER — Other Ambulatory Visit (HOSPITAL_COMMUNITY): Payer: Self-pay

## 2021-10-14 DIAGNOSIS — G35 Multiple sclerosis: Secondary | ICD-10-CM | POA: Diagnosis not present

## 2021-10-14 LAB — CBC
HCT: 34.7 % — ABNORMAL LOW (ref 36.0–46.0)
Hemoglobin: 11.5 g/dL — ABNORMAL LOW (ref 12.0–15.0)
MCH: 28.8 pg (ref 26.0–34.0)
MCHC: 33.1 g/dL (ref 30.0–36.0)
MCV: 86.8 fL (ref 80.0–100.0)
Platelets: 398 10*3/uL (ref 150–400)
RBC: 4 MIL/uL (ref 3.87–5.11)
RDW: 13.8 % (ref 11.5–15.5)
WBC: 9.5 10*3/uL (ref 4.0–10.5)
nRBC: 0 % (ref 0.0–0.2)

## 2021-10-14 LAB — GLUCOSE, CAPILLARY
Glucose-Capillary: 160 mg/dL — ABNORMAL HIGH (ref 70–99)
Glucose-Capillary: 96 mg/dL (ref 70–99)

## 2021-10-14 MED ORDER — HYDROXYZINE HCL 25 MG PO TABS
25.0000 mg | ORAL_TABLET | Freq: Three times a day (TID) | ORAL | 0 refills | Status: DC | PRN
  Filled 2021-10-14: qty 30, 10d supply, fill #0

## 2021-10-14 NOTE — TOC Transition Note (Signed)
Transition of Care [facility]) - CM/SW Discharge Note   Patient Details  Name: Shelley Simon MRN: [id number] Date of Birth: August 08, 2000  [facility] CM/SW Contact:  Tom-Johnson, Hershal Coria, RN Phone Number: 10/14/2021, 11:10 AM   Clinical Narrative:     Patient is scheduled for discharge today. Outpatient PT/OT order given to patient at bedside as she has not yet decided on the facility she will go to. Walker from [employer] at bedside. Family to transport at discharge. No further TOC needs noted.   Final next level of care: OP Rehab Barriers to Discharge: Barriers Resolved   Patient Goals and CMS Choice Patient states their goals for this hospitalization and ongoing recovery are:: To return home CMS Medicare.gov Compare Post Acute Care list provided to:: Patient Choice offered to / list presented to : Patient, Parent (Grand parents)  Discharge Placement                Patient to be transferred to facility by: Family      Discharge Plan and Services   Discharge Planning Services: CM Consult Post Acute Care Choice:  (Outpatient PT)          DME Arranged: Dan Humphreys rolling DME Agency: [employer] Date DME Agency Contacted: 10/10/21 Time DME Agency Contacted: 1440 Representative spoke with at DME Agency: Arnold Long HH Arranged: NA          Social Determinants of Health (SDOH) Interventions     Readmission Risk Interventions     View : No data to display.

## 2021-10-14 NOTE — Plan of Care (Signed)
  Problem: Activity: Goal: Risk for activity intolerance will decrease Outcome: Progressing   Problem: Nutrition: Goal: Adequate nutrition will be maintained Outcome: Progressing   

## 2021-10-14 NOTE — Discharge Summary (Addendum)
Name: Shelley Simon MRN: [id number] DOB: 23-Sep-2000 21 y.o. PCP: Lucianne Lei, MD  Date of Admission: 10/09/2021  4:02 PM Date of Discharge: 10/14/2021 Attending Physician: Ginnie Smart, MD   Discharge Diagnosis: 1. Acute Flare of Multiple Sclerosis   Discharge Medications: Allergies as of 10/14/2021       Reactions   Amoxicillin Hives   Augmentin [amoxicillin-pot Clavulanate] Hives   Penicillins Hives   Zithromax [azithromycin] Hives        Medication List     TAKE these medications    diphenhydrAMINE 25 MG tablet Commonly known as: BENADRYL Take 25 mg by mouth at bedtime as needed for allergies or sleep.   DULoxetine 60 MG capsule Commonly known as: CYMBALTA Take 60 mg by mouth at bedtime.   hydrOXYzine 25 MG tablet Commonly known as: ATARAX Take 1 tablet (25 mg total) by mouth 3 (three) times daily as needed for anxiety.   ibuprofen 200 MG tablet Commonly known as: ADVIL Take 800 mg by mouth every 6 (six) hours as needed for headache or moderate pain.   Vitamin D (Ergocalciferol) 1.25 MG (50000 UNIT) Caps capsule Commonly known as: DRISDOL Take 50,000 Units by mouth every Monday.               Durable Medical Equipment  (From admission, onward)           Start     Ordered   10/14/21 1055  DME Walker  Once       Question Answer Comment  Walker: With 5 Inch Wheels   Patient needs a walker to treat with the following condition Multiple sclerosis (HCC)      10/14/21 1056   10/10/21 1509  For home use only DME Walker rolling  Once       Question Answer Comment  Walker: With 5 Inch Wheels   Patient needs a walker to treat with the following condition Gait instability      10/10/21 1509            Disposition and follow-up:   Shelley Simon was discharged from [facility] in Stable condition.  At the hospital follow up visit please address:  1. Signs and symptoms of a recurrent MS flare.  2.  Hyperglycemia. In-patient, this was believed to be secondary to solumedrol. Please ensure that she has returned to euglycemia.  3. 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  Pending labs/ test needing follow-up: anti-NMO and anti-MOG antibodies  Follow-up Appointments:  Shelley Simon is a 21 y.o.female with a history of obesity, depression, anxiety who was admitted to [facility] for an acute multiple sclerosis flare. Hospital course is detailed below:  Acute MS flare Newly diagnosed multiple sclerosis Patient on presentation had right extremity weakness/numbness and lower back pain.  On MRI, there are multiple hyperintense T2 foci in periventricular white matter, cervical spinal cord as well as possibly in T3-T4.  Given these findings and gait difficulties as well as numbness, neurology was consulted and recommended starting Solu-Medrol 1 g IV daily for 5-day course and monitor symptoms.  Patient's neurological symptoms began to improve throughout hospitalization.  On discharge, neurology recommended outpatient neurology follow-up for discussion of initiating disease modifying therapy. Of note, pt was temporarily placed on SSI while on her steroid regimen here. She was discharged with outpatient PT and OT.  #Hypotension  Sinus tachycardia:  Orthostatic vitals paradoxically  showed decreased HR and increased BP on standing. Hypovolemia seems unlikely with adequate urine output. Afebrile. TSH wnl. Dysautonomia may be a factor secondary to demyelinating process.   Other chronic conditions were medically managed with home medications and formulary alternatives as necessary (Depression).  PCP Follow-up Recommendations:  Discharge Exam:   BP 128/70 (BP Location: Left Arm)   Pulse 92   Temp 98.3 F (36.8 C) (Oral)   Resp 18   Ht  (1.626 m)   Wt 127 kg   SpO2 98%   BMI 48.04 kg/m  Discharge exam:   Constitutional:      General: She is not in acute distress.    Appearance: She is obese. She is not ill-appearing or diaphoretic.  HENT:     Head: Normocephalic and atraumatic.  Cardiovascular:     Rate and Rhythm: Normal rate and regular rhythm.  Abdominal:     General: Bowel sounds are normal.     Palpations: Abdomen is soft.  Skin:    General: Skin is warm and dry.  Neurological:     Mental Status: She is alert and oriented to person, place, and time. Mental status is at baseline.     Cranial Nerves: Cranial nerves 2-12 are intact. No dysarthria.     Sensory: Sensory deficit present.     Motor: Weakness present. No tremor, atrophy, abnormal muscle tone or seizure activity.     Coordination: Finger-Nose-Finger Test abnormal.     Comments: Numbness and tingling in RUE compared to LUE.  Strength: 4+/5 RUE, 5/5 all other extremities  Pertinent Labs, Studies, and Procedures:  DG Lumbar Spine Complete  Result Date: 10/09/2021 CLINICAL DATA:  Low back pain EXAM: LUMBAR SPINE - COMPLETE 4+ VIEW COMPARISON:  None FINDINGS: Transitional anatomy. There are either diminutive first lumbar ribs or a sacralized L5 segment. Alignment is normal. Disc heights are normal. No evidence of facet arthropathy or pars defect. Sacroiliac joints appear normal. IMPRESSION: Transitional anatomy. No evidence of degenerative change or pars defect. No abnormality seen to explain pain. Electronically Signed   By: Paulina Fusi M.D.   On: 10/09/2021 18:09   CT Head Wo Contrast  Result Date: 10/09/2021 CLINICAL DATA:  Headache numbness EXAM: CT HEAD WITHOUT CONTRAST TECHNIQUE: Contiguous axial images were obtained from the base of the skull through the vertex without intravenous contrast. RADIATION DOSE REDUCTION: This exam was performed according to the departmental dose-optimization program which includes automated exposure control, adjustment of the mA and/or kV according to patient size and/or use of iterative  reconstruction technique. COMPARISON:  None Available. FINDINGS: Brain: No evidence of acute infarction, hemorrhage, hydrocephalus, extra-axial collection or mass lesion/mass effect. Vascular: No hyperdense vessel or unexpected calcification. Skull: Normal. Negative for fracture or focal lesion. Sinuses/Orbits: No acute finding. Other: None IMPRESSION: Negative non contrasted CT appearance of the brain Electronically Signed   By: Jasmine Pang M.D.   On: 10/09/2021 17:44   MR BRAIN W WO CONTRAST  Result Date: 10/09/2021 CLINICAL DATA:  Pressure in her spine and numbness in her extremities, mental status change, stroke suspected EXAM: MRI HEAD WITHOUT AND WITH CONTRAST TECHNIQUE: Multiplanar, multiecho pulse sequences of the brain and surrounding structures were obtained without and with intravenous contrast. CONTRAST:  10mL GADAVIST GADOBUTROL 1 MMOL/ML IV SOLN COMPARISON:  No prior MRI, correlation is made with CT head 10/09/2021 FINDINGS: Brain: No restricted diffusion to suggest acute or subacute infarct. No acute hemorrhage, mass, mass effect, or midline shift. No hemosiderin deposition to  suggest remote hemorrhage. No hydrocephalus or extra-axial collection. Punctate focus of possible contrast enhancement in the pons is not associated with any other abnormal signal and is favored to represent a small capillary telangiectasia. No other abnormal parenchymal or meningeal enhancement. Several ovoid, radially oriented, T2 hyperintense foci are noted in the periventricular white matter (series 6, image 21 and 24, for example). Vascular: Normal arterial flow voids. Skull and upper cervical spine: Normal marrow signal. No abnormal enhancement. Sinuses/Orbits: No acute finding. Other: The mastoids are well aerated. IMPRESSION: 1. Radially oriented T2 hyperintense foci in the periventricular white matter, which are concerning for multiple sclerosis. No enhancing lesions to suggest active demyelination. 2. No  additional acute intracranial process. No evidence of acute or subacute infarct. Electronically Signed   By: Wiliam Ke M.D.   On: 10/09/2021 21:48   MR Cervical Spine W or Wo Contrast  Result Date: 10/09/2021 CLINICAL DATA:  Ataxia, stroke suspected EXAM: MRI CERVICAL AND THORACIC SPINE WITHOUT AND WITH CONTRAST TECHNIQUE: Multiplanar and multiecho pulse sequences of the cervical spine, to include the craniocervical junction and cervicothoracic junction, and the thoracic spine, were obtained without and with intravenous contrast. CONTRAST:  10mL GADAVIST GADOBUTROL 1 MMOL/ML IV SOLN COMPARISON:  None Available. FINDINGS: MRI CERVICAL SPINE FINDINGS Alignment: Straightening of the normal cervical lordosis, which may be positional. No listhesis. Vertebrae: No acute fracture or suspicious osseous lesion. No abnormal enhancement. Cord: Mildly increased T2 signal in the right-greater-than-left aspect of the spinal cord, extending from inferior aspect of C2 through the superior aspect of C5 (series 10, image 9), with some areas that are near holocord involvement (series 13, image 10). Additional more focal and more intense areas of T2 hyperintense signal in the dorsal left paracentral cord at C4 (series 13, image 11), although this is only seen on a single slice. Possible additional focus in the dorsal cord at C6 (series 13, images 23-25)(series 13, image 23). No abnormal cord enhancement. Posterior Fossa, vertebral arteries, paraspinal tissues: Negative. Disc levels: C4-C5: Small left paracentral disc protrusion. No spinal canal stenosis or neural foraminal narrowing. Small central disc protrusion. No spinal canal stenosis or neural foraminal narrowing. MRI THORACIC SPINE FINDINGS Alignment: Mild S shaped curvature of the thoracolumbar spine. No listhesis. Vertebrae: Acute osseous abnormality.  No abnormal enhancement. Cord: Evaluation is somewhat limited by motion artifact. Possible area of mildly increased T2  signal in the central cord that extends over several slices at T3-T4 (series 21, images 12-15 and series 18, image 10), although this may be artifactual. No abnormal enhancement. Paraspinal and other soft tissues: Negative. Disc levels: No spinal canal stenosis or neural foraminal narrowing. IMPRESSION: 1. Areas of increased T2 signal in the cervical spinal cord, with 1 possible cervical lesion extending approximately 2-3 vertebral bodies in craniocaudal dimension. Additional smaller T2 hyperintense foci in the spinal cord at C4 and C6 are also seen. These remain concerning for multiple sclerosis. No abnormal enhancement to suggest active demyelination. 2. Evaluation of the thoracic spinal cord is somewhat limited by motion artifact. There is possible T2 hyperintense lesion at T3-T4, although this may be artifactual. No abnormal enhancement to suggest active demyelination. 3. No spinal canal stenosis or neural foraminal narrowing. Electronically Signed   By: Wiliam Ke M.D.   On: 10/09/2021 23:38   MR THORACIC SPINE W WO CONTRAST  Result Date: 10/09/2021 CLINICAL DATA:  Ataxia, stroke suspected EXAM: MRI CERVICAL AND THORACIC SPINE WITHOUT AND WITH CONTRAST TECHNIQUE: Multiplanar and multiecho pulse sequences of the cervical  spine, to include the craniocervical junction and cervicothoracic junction, and the thoracic spine, were obtained without and with intravenous contrast. CONTRAST:  53mL GADAVIST GADOBUTROL 1 MMOL/ML IV SOLN COMPARISON:  None Available. FINDINGS: MRI CERVICAL SPINE FINDINGS Alignment: Straightening of the normal cervical lordosis, which may be positional. No listhesis. Vertebrae: No acute fracture or suspicious osseous lesion. No abnormal enhancement. Cord: Mildly increased T2 signal in the right-greater-than-left aspect of the spinal cord, extending from inferior aspect of C2 through the superior aspect of C5 (series 10, image 9), with some areas that are near holocord involvement (series  13, image 10). Additional more focal and more intense areas of T2 hyperintense signal in the dorsal left paracentral cord at C4 (series 13, image 11), although this is only seen on a single slice. Possible additional focus in the dorsal cord at C6 (series 13, images 23-25)(series 13, image 23). No abnormal cord enhancement. Posterior Fossa, vertebral arteries, paraspinal tissues: Negative. Disc levels: C4-C5: Small left paracentral disc protrusion. No spinal canal stenosis or neural foraminal narrowing. Small central disc protrusion. No spinal canal stenosis or neural foraminal narrowing. MRI THORACIC SPINE FINDINGS Alignment: Mild S shaped curvature of the thoracolumbar spine. No listhesis. Vertebrae: Acute osseous abnormality.  No abnormal enhancement. Cord: Evaluation is somewhat limited by motion artifact. Possible area of mildly increased T2 signal in the central cord that extends over several slices at T3-T4 (series 21, images 12-15 and series 18, image 10), although this may be artifactual. No abnormal enhancement. Paraspinal and other soft tissues: Negative. Disc levels: No spinal canal stenosis or neural foraminal narrowing. IMPRESSION: 1. Areas of increased T2 signal in the cervical spinal cord, with 1 possible cervical lesion extending approximately 2-3 vertebral bodies in craniocaudal dimension. Additional smaller T2 hyperintense foci in the spinal cord at C4 and C6 are also seen. These remain concerning for multiple sclerosis. No abnormal enhancement to suggest active demyelination. 2. Evaluation of the thoracic spinal cord is somewhat limited by motion artifact. There is possible T2 hyperintense lesion at T3-T4, although this may be artifactual. No abnormal enhancement to suggest active demyelination. 3. No spinal canal stenosis or neural foraminal narrowing. Electronically Signed   By: Wiliam Ke M.D.   On: 10/09/2021 23:38        Latest Ref Rng & Units 10/14/2021   12:51 AM 10/13/2021    1:54  AM 10/11/2021    8:16 AM  CBC  WBC 4.0 - 10.5 K/uL 9.5   10.7   18.3    Hemoglobin 12.0 - 15.0 g/dL 62.6   94.8   54.6    Hematocrit 36.0 - 46.0 % 34.7   35.0   39.2    Platelets 150 - 400 K/uL 398   385   425        Latest Ref Rng & Units 10/13/2021    1:54 AM 10/11/2021    8:16 AM 10/10/2021    4:50 AM  BMP  Glucose 70 - 99 mg/dL 270   350   94    BUN 6 - 20 mg/dL 14   13   11     Creatinine 0.44 - 1.00 mg/dL   0.93   8.18    Sodium 135 - 145 mmol/L 140   134   136    Potassium 3.5 - 5.1 mmol/L 3.5   3.6   3.7    Chloride 98 - 111 mmol/L 106   102   108    CO2 22 - 32  mmol/L Calcium 8.9 - 10.3 mg/dL 8.6   8.8   8.5      Discharge Instructions: Discharge Instructions     Ambulatory referral to Neurology   Complete by: As directed    An appointment is requested in approximately: 2-4 weeks MS flare. Newly diagnosed multiple sclerosis   Ambulatory referral to Occupational Therapy   Complete by: As directed    Ambulatory referral to Physical Therapy   Complete by: As directed    Ambulatory referral to Physical Therapy   Complete by: As directed    Call MD for:   Complete by: As directed    Call MD for:  difficulty breathing, headache or visual disturbances   Complete by: As directed    Call MD for:  extreme fatigue   Complete by: As directed    Call MD for:  persistant dizziness or light-headedness   Complete by: As directed    Call MD for:  persistant nausea and vomiting   Complete by: As directed    Call MD for:  temperature >100.4   Complete by: As directed    Discharge instructions   Complete by: As directed    Shelley Simon,  You were hospitalized for a multiple sclerosis (MS) flare. MS is a disorder where the body's immune system mistakenly attacks a protein in our brain and spinal cord called myelin. Myelin is sort-of like insulation for our neurons - like the rubber coating around a copper wire. As this happens, people can develop symptoms related  to their nervous system. With treatment, MS flares can be prevented.  We arranged for you to follow up at [facility] with Dr. Epimenio Foot. Someone should be reaching out to you to schedule that appointment. You should see Dr. Epimenio Foot in the next 2-4 weeks. If no one has called you to schedule the appointment, you can reach [facility] at [phone].  In terms of medications changes, please START taking:  -Hydralazine as needed for anxiety  Please make sure to do the following:  --Pay close attention to the basics of wellness. Drink plenty of water. Sleep at least 8 hours a night. Eat whole foods. Exercise, though don't overexert yourself. And keep watching plenty of movies that bring you joy. --As you've heard, heat can be unbearable for people with MS. Try to stay cool, both indoors and outdoors.  --Take all of your medications as prescribed. --Attend all of your follow-up appointments.  Trust your body when you think you might be having another MS flare. If you think you might be having one, come to the hospital so that you can receive more treatment. Here are some typical symptoms of an MS flare: --Pain and/or vision loss in one eye --Double vision --Trouble walking --Numbness and tingling --Inability to control your bladder or bowels  The [facility] MS Society has reliable, helpful information about MS. A great place to start learning more is at [email or web address]   Please call our clinic if you have any questions or concerns, we may be able to help and keep you from a long and expensive emergency room wait. Our internal me clinic and after hours phone number is [phone], the best time to call is Monday through Friday 9 am to 4 pm but there is always someone available 24/7 if you have an emergency. If you need medication refills please notify your pharmacy one week in advance and they will send [place]  a  request.  Thank you for allowing [place] to be part of your care. We are here for you as you navigate this new journey. I found this quote from [name] - I hope you find it comforting:  "Let us be reminded that before there is a final solution, there must be a first solution, a second one, even a third. The move toward a final solution is not a jump. It takes one step, then another, then another," - [name],      Shelley Simon,   You were hospitalized for a multiple sclerosis (MS) flare. MS is a disorder where the body's immune system mistakenly attacks a protein in our brain and spinal cord called myelin. Myelin is sort-of like insulation for our neurons - like the rubber coating around a copper wire. As this happens, people can develop symptoms related to their nervous system. With treatment, MS flares can be prevented.   We arranged for you to follow up at [facility] with Dr. Epimenio Foot. Someone should be reaching out to you to schedule that appointment. You should see Dr. Epimenio Foot in the next 2-4 weeks. If no one has called you to schedule the appointment, you can reach [facility] associates at [phone].   In terms of medications changes, please START taking:  -Hydralazine as needed for anxiety   Please make sure to do the following:   --Pay close attention to the basics of wellness. Drink plenty of water. Sleep at least 8 hours a night. Eat whole foods. Exercise, though don't overexert yourself. And keep watching plenty of movies that bring you joy.  --As you've heard, heat can be unbearable for people with MS. Try to stay cool, both indoors and outdoors.  --Take all of your medications as prescribed.  --Attend all of your follow-up appointments.   Trust your body when you think you might be having another MS flare. If you think you might be having one, come to the hospital so that you can receive more treatment. Here are some typical symptoms of an MS flare:   --Pain and/or vision loss in one eye  --Double vision  --Trouble walking  --Numbness and tingling  --Inability to control your bladder or bowels   The [facility] MS Society has reliable, helpful information about MS. A great place to start learning more is at [email or web address]   Please call our clinic if you have any questions or concerns, we may be able to help and keep you from a long and expensive emergency room wait. Our internal me clinic and after hours phone number is [phone], the best time to call is Monday through Friday 9 am to 4 pm but there is always someone available 24/7 if you have an emergency. If you need medication refills please notify your pharmacy one week in advance and they will send [place] a request.   Thank you for allowing [place] to be part of your care. We are here for you as you navigate this new journey. I found this quote from [name] - I hope you find it comforting:   "Let us be reminded that before there is a final solution, there must be a first solution, a second one, even a third. The move toward a final solution is not a jump. It takes one step, then another, then another,"  - [name],  Signed: Andrey Campanile, MD 10/14/2021, 1:13 PM   Pager: @[email or web address] @

## 2021-10-14 NOTE — Progress Notes (Signed)
PT Cancellation Note  Patient Details Name: Shelley Simon MRN: [id number] DOB: 09-27-2000   Cancelled Treatment:    Reason Eval/Treat Not Completed: Patient declined, no reason specified. When checked in on patient she wanted to get dressed in prep to go home. Voiced no concerns currently regarding mobility. Dan Humphreys has been delivered and she is going to OPPT upon discharge.       Agapito Hanway 10/14/2021, 1:42 PM

## 2021-10-14 NOTE — Discharge Instructions (Addendum)
Shelley Simon,   You were hospitalized for a multiple sclerosis (MS) flare. MS is a disorder where the body's immune system mistakenly attacks a protein in our brain and spinal cord called myelin. Myelin is sort-of like insulation for our neurons - like the rubber coating around a copper wire. As this happens, people can develop symptoms related to their nervous system. With treatment, MS flares can be prevented.   We arranged for you to follow up at [facility] with Dr. Epimenio Foot. Someone should be reaching out to you to schedule that appointment. You should see Dr. Epimenio Foot in the next 2-4 weeks. If no one has called you to schedule the appointment, you can reach [facility] at [phone].   In terms of medications changes, please START taking:  -Hydralazine as needed for anxiety   Please make sure to do the following:   --Pay close attention to the basics of wellness. Drink plenty of water. Sleep at least 8 hours a night. Eat whole foods. Exercise, though don't overexert yourself. And keep watching plenty of movies that bring you joy.  --As you've heard, heat can be unbearable for people with MS. Try to stay cool, both indoors and outdoors.  --Take all of your medications as prescribed.  --Attend all of your follow-up appointments.   Trust your body when you think you might be having another MS flare. If you think you might be having one, come to the hospital so that you can receive more treatment. Here are some typical symptoms of an MS flare:  --Pain and/or vision loss in one eye  --Double vision  --Trouble walking  --Numbness and tingling  --Inability to control your bladder or bowels   The [facility] MS Society has reliable, helpful information about MS. A great place to start learning more is at [email or web address]   Please call our clinic if you have any questions or concerns, we may be able to help and keep  you from a long and expensive emergency room wait. Our internal me clinic and after hours phone number is [phone], the best time to call is Monday through Friday 9 am to 4 pm but there is always someone available 24/7 if you have an emergency. If you need medication refills please notify your pharmacy one week in advance and they will send [place] a request.   Thank you for allowing [place] to be part of your care. We are here for you as you navigate this new journey. I found this quote from [name] - I hope you find it comforting:   "Let us be reminded that before there is a final solution, there must be a first solution, a second one, even a third. The move toward a final solution is not a jump. It takes one step, then another, then another,"  - [name],  INTERNAL MEDICINE MD INSTRUCTIONS: You were seen in the hospital and found to have MS. We started you on steroids while you were here, which helped to improve your symptoms. You will follow-up with neurology outside of the hospital - they will contact you to arrange this. You will also see physical and occupational therapy outside of the hospital. Please follow-up with your primary care doctor in 1-2 weeks to discuss your recent hospitalization. If any symptoms change or worsen acutely, please return to the nearest emergency department.

## 2021-10-14 NOTE — Progress Notes (Signed)
Neurology Progress Note  Brief HPI: Shelley Simon is a 21 year old female with history of severe obesity and depression/anxiety presenting with progressive neuropathy and weakness admitted for new-onset multiple sclerosis.   Subjective: Day 5 of IV steroids today. Reports subjectively feeling better.  Exam: Vitals:   10/13/21 2017 10/14/21 0443  BP: 113/84 114/70  Pulse: 74 71  Resp: 16 17  Temp: 98 F (36.7 C) 98 F (36.7 C)  SpO2: 98% 97%   General: Awake alert in no distress HEENT: Normocephalic atraumatic CVs: Regular rhythm Abdomen nondistended nontender Respiratory: Breathing well saturating normally on room air Extremities warm well perfused Neurological exam Awake alert oriented x3 Speech is nondysarthric No evidence of aphasia Cranial nerve examination reveals cranial nerves II to XII intact Motor examination with symmetric strength in all 4 extremities Sensation diminished on the left hemibody involving the arm and leg. Coordination with no dysmetria DTRs: Hyperreflexic with positive Hoffmann sign, 3+ reflexes all over, nonsustained ankle clonus with mute toes bilaterally.     Infusions:  Medications:   DULoxetine  60 mg Oral QHS   insulin aspart  0-20 Units Subcutaneous TID WC   insulin glargine-yfgn  3 Units Subcutaneous QHS   rivaroxaban  10 mg Oral Daily   PRN medications  Review of Systems: Except for pertinent positives listed in the HPI, all other systems were reviewed and are negative  Results Labs:  Results for orders placed or performed during the hospital encounter of 10/09/21 (from the past 24 hour(s))  Glucose, capillary   Collection Time: 10/13/21 11:49 AM  Result Value Ref Range   Glucose-Capillary 193 (H) 70 - 99 mg/dL  Glucose, capillary   Collection Time: 10/13/21  4:55 PM  Result Value Ref Range   Glucose-Capillary 145 (H) 70 - 99 mg/dL  Glucose, capillary   Collection Time: 10/13/21  9:10 PM  Result Value Ref Range    Glucose-Capillary 168 (H) 70 - 99 mg/dL  CBC   Collection Time: 10/14/21 12:51 AM  Result Value Ref Range   WBC 9.5 4.0 - 10.5 K/uL   RBC 4.00 3.87 - 5.11 MIL/uL   Hemoglobin 11.5 (L) 12.0 - 15.0 g/dL   HCT 44.0 (L) 10.2 - 72.5 %   MCV 86.8 80.0 - 100.0 fL   MCH 28.8 26.0 - 34.0 pg   MCHC 33.1 30.0 - 36.0 g/dL   RDW 36.6 44.0 - 34.7 %   Platelets 398 150 - 400 K/uL   nRBC 0.0 0.0 - 0.2 %  Glucose, capillary   Collection Time: 10/14/21  7:15 AM  Result Value Ref Range   Glucose-Capillary 160 (H) 70 - 99 mg/dL     Imaging Reviewed: MRI brain with without contrast done on 10/09/2021   IMPRESSION: 1. Radially oriented T2 hyperintense foci in the periventricular white matter, which are concerning for multiple sclerosis. No enhancing lesions to suggest active demyelination. 2. No additional acute intracranial process. No evidence of acute or subacute infarct.  MRI cervical spine/thoracic spine with without contrast done on 10/09/2021 IMPRESSION: 1. Areas of increased T2 signal in the cervical spinal cord, with 1 possible cervical lesion extending approximately 2-3 vertebral bodies in craniocaudal dimension. Additional smaller T2 hyperintense foci in the spinal cord at C4 and C6 are also seen. These remain concerning for multiple sclerosis. No abnormal enhancement to suggest active demyelination. 2. Evaluation of the thoracic spinal cord is somewhat limited by motion artifact. There is possible T2 hyperintense lesion at T3-T4, although this may  be artifactual. No abnormal enhancement to suggest active demyelination. 3. No spinal canal stenosis or neural foraminal narrowing.   Assessment: 21 year old female who presented with gait instability, weakness in bilateral upper extremities, with 5 years of occasional difficulty seeing, as well as episode of numbness, difficulty using her hands especially her right hand as well as worsening numbness and having difficulty comprehending  after reading months Imaging of the brain, cervical and thoracic spine is consistent with a diagnosis of multiple sclerosis. Started on IV steroids-Solu-Medrol 1 g daily for 5 doses which she is completing today. Reports significant improvement Had questions about family's risk of MS and evaluation for his sister who has some optic nerve issues-recommended follow-up with Dr. Epimenio Foot at [facility] neurolog[facility].  Impression: New onset MS/demyelinating disease. Evaluate for NMO versus MOG antibody associated disease  Recommendations: Completed 5 days of IV steroids Follow-up with Dr. Despina Arias neurology for discussion on disease modifying therapy I have sent out anti-NMO and anti-MOG antibodies-we will need outpatient follow-up. Plan discussed with the primary team including residents and attending over rounding at bedside. Please call with questions as needed.  -- Milon Dikes, MD Neurologist [facility] Pager: [phone]

## 2021-10-14 NOTE — Progress Notes (Signed)
DISCHARGE NOTE HOME Shelley Simon to be discharged Home per MD order. Discussed prescriptions and follow up appointments with the patient. Prescriptions given to patient; medication list explained in detail. Patient verbalized understanding.  Skin clean, dry and intact without evidence of skin break down, no evidence of skin tears noted. IV catheter discontinued intact. Site without signs and symptoms of complications. Dressing and pressure applied. Pt denies pain at the site currently. No complaints noted.  Patient free of lines, drains, and wounds.   An After Visit Summary (AVS) was printed and given to the patient. Patient escorted via wheelchair, and discharged home via private auto.  Velia Meyer, RN

## 2021-10-14 NOTE — Progress Notes (Addendum)
Occupational Therapy Treatment Patient Details Name: Shelley Simon MRN: [id number] DOB: 2001/04/29 Today's Date: 10/14/2021   History of present illness 21 y/o female presented to ED on 10/09/21 for c/o lower back pain, weakness, and bilateral UE and LE numbness. Imaging consistent with diagnosis of multiple sclerosis. PMH: depression/anxiety   OT comments  Pt progressing towards goals, min guard for LB dressing and simulated toilet transfer this session. Pt able to verbalize/demo UE exercises from previous session using theraband/theraputty. Pt educated on energy conservation strategies (handout provided) to use for home, pt verbalized understanding. Educated pt on use of AE for LB dressing/bathing as needed and use of shower seat, pt verbalized understanding. Pt presenting with impairments listed below, will follow acutely. Continue to recommend OP OT at d/c.   Recommendations for follow up therapy are one component of a multi-disciplinary discharge planning process, led by the attending physician.  Recommendations may be updated based on patient status, additional functional criteria and insurance authorization.    Follow Up Recommendations  Outpatient OT    Assistance Recommended at Discharge Intermittent Supervision/Assistance  Patient can return home with the following  Assistance with cooking/housework;A little help with bathing/dressing/bathroom;Help with stairs or ramp for entrance;Assist for transportation   Equipment Recommendations  None recommended by OT    Recommendations for Other Services      Precautions / Restrictions Precautions Precautions: Fall Precaution Comments: Fall risk is present, but minimized with use of RW Restrictions Weight Bearing Restrictions: No       Mobility Bed Mobility Overal bed mobility: Modified Independent             General bed mobility comments: HOB up    Transfers Overall transfer level: Needs assistance Equipment used:  Rolling walker (2 wheels) Transfers: Sit to/from Stand Sit to Stand: Supervision           General transfer comment: supervision for safety     Balance Overall balance assessment: Mild deficits observed, not formally tested                                         ADL either performed or assessed with clinical judgement   ADL Overall ADL's : Needs assistance/impaired                     Lower Body Dressing: Min guard;Sit to/from stand Lower Body Dressing Details (indicate cue type and reason): figure 4 to don/doff socks Toilet Transfer: Min guard;Ambulation;Rolling walker (2 wheels);Regular Glass blower/designer Details (indicate cue type and reason): simulated in room/hall         Functional mobility during ADLs: Min guard;Rolling walker (2 wheels) General ADL Comments: pt/family state they will be getting seat for shower    Extremity/Trunk Assessment Upper Extremity Assessment Upper Extremity Assessment: RUE deficits/detail;LUE deficits/detail RUE Deficits / Details: 4-/5 strength, numbness RUE Coordination: decreased fine motor LUE Deficits / Details: 4/5 strength, numbness LUE Coordination: decreased fine motor   Lower Extremity Assessment Lower Extremity Assessment: Defer to PT evaluation        Vision   Vision Assessment?: No apparent visual deficits   Perception Perception Perception: Not tested   Praxis Praxis Praxis: Not tested    Cognition Arousal/Alertness: Awake/alert Behavior During Therapy: WFL for tasks assessed/performed Overall Cognitive Status: Within Functional Limits for tasks assessed  Exercises General Exercises - Upper Extremity Shoulder Flexion: Strengthening, Both, 10 reps, Seated, Theraband Theraband Level (Shoulder Flexion): Level 2 (Red) Digit Composite Flexion: AROM, Seated, Strengthening Composite Extension: AROM, Seated, Strengthening     Shoulder Instructions       General Comments reviewed exercises, educated on energy conservation strategies with handout provided    Pertinent Vitals/ Pain       Pain Assessment Pain Assessment: Faces Faces Pain Scale: Hurts a little bit Pain Location: hands and feet Pain Descriptors / Indicators: Numbness Pain Intervention(s): Limited activity within patient's tolerance, Monitored during session  Home Living                                          Prior Functioning/Environment              Frequency  Min 2X/week        Progress Toward Goals  OT Goals(current goals can now be found in the care plan section)  Progress towards OT goals: Progressing toward goals  Acute Rehab OT Goals OT Goal Formulation: With patient Time For Goal Achievement: 10/24/21 Potential to Achieve Goals: Good ADL Goals Pt Will Transfer to Toilet: with modified independence;regular height toilet Pt Will Perform Tub/Shower Transfer: with modified independence;ambulating;shower seat Pt/caregiver will Perform Home Exercise Program: Increased strength;Both right and left upper extremity;With theraband;With theraputty Additional ADL Goal #1: Pt will complete basic ADLs modified independently.  Plan Discharge plan remains appropriate    Co-evaluation                 AM-PAC OT "6 Clicks" Daily Activity     Outcome Measure   Help from another person eating meals?: None Help from another person taking care of personal grooming?: A Little Help from another person toileting, which includes using toliet, bedpan, or urinal?: A Little Help from another person bathing (including washing, rinsing, drying)?: A Little Help from another person to put on and taking off regular upper body clothing?: None Help from another person to put on and taking off regular lower body clothing?: A Little 6 Click Score: 20    End of Session Equipment Utilized During Treatment: Rolling walker  (2 wheels);Gait belt  OT Visit Diagnosis: Other abnormalities of gait and mobility (R26.89);Muscle weakness (generalized) (M62.81)   Activity Tolerance Patient tolerated treatment well   Patient Left in bed;with call bell/phone within reach   Nurse Communication Mobility status (notified of pt's IV beeping)        Time: [id number] OT Time Calculation (min): 22 min  Charges: OT General Charges $OT Visit: 1 Visit OT Treatments $Self Care/Home Management : 8-22 mins  Lynnda Child, OTD, OTR/L Acute Rehab [phone]) [phone] - [place] 10/14/2021, 10:32 AM

## 2021-10-15 LAB — MISC LABCORP TEST (SEND OUT): Labcorp test code: 505310

## 2021-10-15 LAB — NEUROMYELITIS OPTICA AUTOAB, IGG: NMO-IgG: <1.5 U/mL (ref 0.0–3.0)

## 2021-10-20 NOTE — Progress Notes (Unsigned)
[facility]  PATIENT: Shelley Simon DOB: March 16, 2001  REFERRING DOCTOR OR PCP: Bobby Rumpf, MD; Nicholos Johns, MD SOURCE: Patient, notes from recent hospitalization, imaging and lab reports, MRI images personally reviewed.  _________________________________   HISTORICAL  CHIEF COMPLAINT:  No chief complaint on file.   HISTORY OF PRESENT ILLNESS:  I had the pleasure of seeing your patient, Shelley Simon, at the [place] at [facility] for neurologic consultation regarding her numbness and abnormal MRIs consistent with MS.  She is a 21 year old woman who presented to the [facility] emergency room 10/08/2021 with worsening arm strength and numbness, right greater than left, since early 2023.  She also noted reduced fine motor control and reported difficulty with typing.  In the ED, she had MRI of the brain showing about 5 T2/FLAIR hyperintense foci in the cerebral hemispheres in the periventricular and juxtacortical white matter.  MRI of the cervical and thoracic spine was also performed.  It showed a T2 hyperintense focus centrally more to the right from C3 to C4-C5 and another subtle focus at C5-C6 posterolaterally to the right..  There were no definite foci within the thoracic spine though the study was limited by motion artifact.  She was admitted and received 5 days of IV Solu-Medrol.  ***vision Imaging studies reviewed: MRI of the brain 10/09/2021 shows 5 T2/FLAIR hyperintense foci in the cerebral hemispheres, some in the periventricular white matter.  1 is in the juxtacortical white matter.  2 foci also noted in the right cerebellar hemisphere.  None of the foci enhance.  MRI of the cervical and thoracic spine 10/09/2021 showed a T2 hyperintense focus from C3 to C4-C5 towards the right and another focus posterolaterally to the right C5-C6.  There is subtle enhancement adjacent to C4 (sagittal postcontrast image 9/16).  Degenerative disc  changes are noted at C4-C5, and T8-T9.  Laboratory studies: May 2023: HIV is negative.  Hemoglobin A1c is negative.  CBC and CMP were noncontributory.  Anti-NMO IgG and anti-MOG antibody were both negative.  REVIEW OF SYSTEMS: Constitutional: No fevers, chills, sweats, or change in appetite Eyes: No visual changes, double vision, eye pain Ear, nose and throat: No hearing loss, ear pain, nasal congestion, sore throat Cardiovascular: No chest pain, palpitations Respiratory:  No shortness of breath at rest or with exertion.   No wheezes GastrointestinaI: No nausea, vomiting, diarrhea, abdominal pain, fecal incontinence Genitourinary:  No dysuria, urinary retention or frequency.  No nocturia. Musculoskeletal:  No neck pain, back pain Integumentary: No rash, pruritus, skin lesions Neurological: as above Psychiatric: No depression at this time.  No anxiety Endocrine: No palpitations, diaphoresis, change in appetite, change in weigh or increased thirst Hematologic/Lymphatic:  No anemia, purpura, petechiae. Allergic/Immunologic: No itchy/runny eyes, nasal congestion, recent allergic reactions, rashes  ALLERGIES: Allergies  Allergen Reactions   Amoxicillin Hives   Augmentin [Amoxicillin-Pot Clavulanate] Hives   Penicillins Hives   Zithromax [Azithromycin] Hives    HOME MEDICATIONS:  Current Outpatient Medications:    diphenhydrAMINE (BENADRYL) 25 MG tablet, Take 25 mg by mouth at bedtime as needed for allergies or sleep., Disp: , Rfl:    DULoxetine (CYMBALTA) 60 MG capsule, Take 60 mg by mouth at bedtime., Disp: , Rfl:    hydrOXYzine (ATARAX) 25 MG tablet, Take 1 tablet (25 mg total) by mouth 3 (three) times daily as needed for anxiety., Disp: 30 tablet, Rfl: 0   ibuprofen (ADVIL) 200 MG tablet, Take 800 mg by mouth every 6 (six) hours as  needed for headache or moderate pain., Disp: , Rfl:    Vitamin D, Ergocalciferol, (DRISDOL) 1.25 MG (50000 UNIT) CAPS capsule, Take 50,000 Units by  mouth every Monday., Disp: , Rfl:   PAST MEDICAL HISTORY: No past medical history on file.  PAST SURGICAL HISTORY: No past surgical history on file.  FAMILY HISTORY: No family history on file.  SOCIAL HISTORY:  Social History   Socioeconomic History   Marital status: Single    Spouse name: Not on file   Number of children: Not on file   Years of education: Not on file   Highest education level: Not on file  Occupational History   Not on file  Tobacco Use   Smoking status: Never   Smokeless tobacco: Never  Vaping Use   Vaping Use: Never used  Substance and Sexual Activity   Alcohol use: Not on file   Drug use: Not on file   Sexual activity: Not on file  Other Topics Concern   Not on file  Social History Narrative   Not on file   Social Determinants of Health   Financial Resource Strain: Not on file  Food Insecurity: Not on file  Transportation Needs: Not on file  Physical Activity: Not on file  Stress: Not on file  Social Connections: Not on file  Intimate Partner Violence: Not on file     PHYSICAL EXAM  There were no vitals filed for this visit.  There is no height or weight on file to calculate BMI.   General: The patient is well-developed and well-nourished and in no acute distress  HEENT:  Head is [place]/AT.  Sclera are anicteric.  Funduscopic exam shows normal optic discs and retinal vessels.  Neck: No carotid bruits are noted.  The neck is nontender.  Cardiovascular: The heart has a regular rate and rhythm with a normal S1 and S2. There were no murmurs, gallops or rubs.    Skin: Extremities are without rash or  edema.  Musculoskeletal:  Back is nontender  Neurologic Exam  Mental status: The patient is alert and oriented x 3 at the time of the examination. The patient has apparent normal recent and remote memory, with an apparently normal attention span and concentration ability.   Speech is normal.  Cranial nerves: Extraocular movements are  full. Pupils are equal, round, and reactive to light and accomodation.  Visual fields are full.  Facial symmetry is present. There is good facial sensation to soft touch bilaterally.Facial strength is normal.  Trapezius and sternocleidomastoid strength is normal. No dysarthria is noted.  The tongue is midline, and the patient has symmetric elevation of the soft palate. No obvious hearing deficits are noted.  Motor:  Muscle bulk is normal.   Tone is normal. Strength is  5 / 5 in all 4 extremities.   Sensory: Sensory testing is intact to pinprick, soft touch and vibration sensation in all 4 extremities.  Coordination: Cerebellar testing reveals good finger-nose-finger and heel-to-shin bilaterally.  Gait and station: Station is normal.   Gait is normal. Tandem gait is normal. Romberg is negative.   Reflexes: Deep tendon reflexes are symmetric and normal bilaterally.   Plantar responses are flexor.    DIAGNOSTIC DATA (LABS, IMAGING, TESTING) - I reviewed patient records, labs, notes, testing and imaging myself where available.  Lab Results  Component Value Date   WBC 9.5 10/14/2021   HGB 11.5 (L) 10/14/2021   HCT 34.7 (L) 10/14/2021   MCV 86.8 10/14/2021   PLT  398 10/14/2021      Component Value Date/Time   NA 140 10/13/2021 0154   K 3.5 10/13/2021 0154   CL 106 10/13/2021 0154   CO2 29 10/13/2021 0154   GLUCOSE 141 (H) 10/13/2021 0154   BUN 14 10/13/2021 0154   CREATININE 0.78 10/13/2021 0154   CALCIUM 8.6 (L) 10/13/2021 0154   PROT 7.7 10/09/2021 1635   ALBUMIN 3.5 10/09/2021 1635   AST 15 10/09/2021 1635   ALT 13 10/09/2021 1635   ALKPHOS 99 10/09/2021 1635   BILITOT 0.5 10/09/2021 1635   GFRNONAA >60 10/13/2021 0154   Lab Results  Component Value Date   CHOL 171 10/11/2021   HDL 45 10/11/2021   LDLCALC 118 (H) 10/11/2021   TRIG 40 10/11/2021   CHOLHDL 3.8 10/11/2021   Lab Results  Component Value Date   HGBA1C 4.7 (L) 10/10/2021   No results found for:  [id number] Lab Results  Component Value Date   TSH 2.722 10/09/2021       ASSESSMENT AND PLAN  ***   Shelley Ahn A. Felecia Shelling, MD, [facility] [id number], [id number] PM Certified in Neurology, Clinical Neurophysiology, Sleep Medicine and Neuroimaging  [facility] [address] [place], [place] [address] [phone]

## 2021-10-21 ENCOUNTER — Ambulatory Visit (INDEPENDENT_AMBULATORY_CARE_PROVIDER_SITE_OTHER): Payer: BC Managed Care – PPO | Admitting: Neurology

## 2021-10-21 ENCOUNTER — Encounter: Payer: Self-pay | Admitting: Neurology

## 2021-10-21 VITALS — BP 124/79 | HR 94 | Ht 64.0 in | Wt 284.5 lb

## 2021-10-21 DIAGNOSIS — R269 Unspecified abnormalities of gait and mobility: Secondary | ICD-10-CM

## 2021-10-21 DIAGNOSIS — R0683 Snoring: Secondary | ICD-10-CM | POA: Diagnosis not present

## 2021-10-21 DIAGNOSIS — R531 Weakness: Secondary | ICD-10-CM | POA: Diagnosis not present

## 2021-10-21 DIAGNOSIS — G4719 Other hypersomnia: Secondary | ICD-10-CM | POA: Diagnosis not present

## 2021-10-21 DIAGNOSIS — G35 Multiple sclerosis: Secondary | ICD-10-CM

## 2021-10-21 DIAGNOSIS — G35D Multiple sclerosis, unspecified: Secondary | ICD-10-CM

## 2021-10-27 ENCOUNTER — Telehealth: Payer: Self-pay | Admitting: Neurology

## 2021-10-27 DIAGNOSIS — Z79899 Other long term (current) drug therapy: Secondary | ICD-10-CM

## 2021-10-27 DIAGNOSIS — G35 Multiple sclerosis: Secondary | ICD-10-CM

## 2021-10-27 NOTE — Telephone Encounter (Signed)
At the visit last week, she had expressed possible interest in the fenebrutinib research study.  However, she informed Jamil in  research that she would prefer to go on a standard medicine.  I called to go over options and I got voicemail.  I left a message and will try to reach her again later this week.  Besides a research study we had also discussed Tysabri and Ocrevus and I can bring her in this week to get some blood work done to help [place] decide between these options.  If an IV would be difficult for her, an S1 P receptor modulators such as Zeposia or Mayzent would also be an option for her.

## 2021-10-27 NOTE — Telephone Encounter (Signed)
I spoke to Shelley Simon.  She would prefer to go on an established medication rather than be part of a clinical study.  At the last visit we had discussed Tysabri and Ocrevus as options.  I will have her get blood work this week for these tests.  I let her know that it can take up to a week to get all the results back.  We will also have her sign a service request forms for Tysabri, Ocrevus and Kesimpta and she will choose between these once the results are back

## 2021-10-27 NOTE — Telephone Encounter (Signed)
Pt has called back re: Dr Felecia Shelling wanting to set up a plan re: medication for MS.  Pt is asking for a call back

## 2021-10-28 ENCOUNTER — Other Ambulatory Visit (HOSPITAL_COMMUNITY): Payer: Self-pay

## 2021-10-28 ENCOUNTER — Other Ambulatory Visit: Payer: Self-pay | Admitting: Internal Medicine

## 2021-10-28 NOTE — Telephone Encounter (Signed)
Tysabri, ocrevus, & kesimpta start forms placed at the front desk for patient's signature.

## 2021-10-29 ENCOUNTER — Other Ambulatory Visit: Payer: Self-pay

## 2021-10-29 ENCOUNTER — Other Ambulatory Visit (INDEPENDENT_AMBULATORY_CARE_PROVIDER_SITE_OTHER): Payer: Self-pay

## 2021-10-29 ENCOUNTER — Encounter: Payer: Self-pay | Admitting: Neurology

## 2021-10-29 ENCOUNTER — Other Ambulatory Visit (HOSPITAL_COMMUNITY): Payer: Self-pay

## 2021-10-29 ENCOUNTER — Telehealth: Payer: Self-pay | Admitting: Neurology

## 2021-10-29 DIAGNOSIS — Z79899 Other long term (current) drug therapy: Secondary | ICD-10-CM

## 2021-10-29 DIAGNOSIS — G35 Multiple sclerosis: Secondary | ICD-10-CM

## 2021-10-29 DIAGNOSIS — Z0289 Encounter for other administrative examinations: Secondary | ICD-10-CM

## 2021-10-29 NOTE — Telephone Encounter (Signed)
Placed [facility] lab in [facility] lock box for routine lab pick up. Results pending. 

## 2021-10-31 ENCOUNTER — Encounter: Payer: Self-pay | Admitting: Neurology

## 2021-11-01 LAB — QUANTIFERON-TB GOLD PLUS
QuantiFERON Mitogen Value: >10 IU/mL
QuantiFERON Nil Value: 0 IU/mL
QuantiFERON TB1 Ag Value: 0 IU/mL
QuantiFERON TB2 Ag Value: 0 IU/mL
QuantiFERON-TB Gold Plus: NEGATIVE

## 2021-11-01 LAB — IGG, IGA, IGM
IgA/Immunoglobulin A, Serum: 288 mg/dL (ref 87–352)
IgG (Immunoglobin G), Serum: 1125 mg/dL (ref 586–1602)
IgM (Immunoglobulin M), Srm: 199 mg/dL (ref 26–217)

## 2021-11-01 LAB — VARICELLA ZOSTER ANTIBODY, IGG: Varicella zoster IgG: 188 index (ref >165)

## 2021-11-01 LAB — HEPATITIS B SURFACE ANTIBODY,QUALITATIVE: Hep B Surface Ab, Qual: NONREACTIVE

## 2021-11-01 LAB — HEPATITIS C ANTIBODY: Hep C Virus Ab: NONREACTIVE

## 2021-11-01 LAB — HIV ANTIBODY (ROUTINE TESTING W REFLEX): HIV Screen 4th Generation wRfx: NONREACTIVE

## 2021-11-01 LAB — HEPATITIS B SURFACE ANTIGEN: Hepatitis B Surface Ag: NEGATIVE

## 2021-11-01 LAB — HEPATITIS B CORE ANTIBODY, TOTAL: Hep B Core Total Ab: NEGATIVE

## 2021-11-05 DIAGNOSIS — G35 Multiple sclerosis: Secondary | ICD-10-CM | POA: Diagnosis not present

## 2021-11-05 DIAGNOSIS — F419 Anxiety disorder, unspecified: Secondary | ICD-10-CM | POA: Diagnosis not present

## 2021-11-05 DIAGNOSIS — R0683 Snoring: Secondary | ICD-10-CM | POA: Diagnosis not present

## 2021-11-05 DIAGNOSIS — Z6837 Body mass index (BMI) 37.0-37.9, adult: Secondary | ICD-10-CM | POA: Diagnosis not present

## 2021-11-05 NOTE — Telephone Encounter (Signed)
[facility] ab drawn on 10/29/21 indeterminate, index: 0.20. Inhibition assay: negative. Gave to MD to review.

## 2021-11-06 ENCOUNTER — Telehealth: Payer: Self-pay | Admitting: Neurology

## 2021-11-06 NOTE — Telephone Encounter (Signed)
Faxed completed/signed Kesimpta start form to [employer] at [phone]. Received fax confirmation.

## 2021-11-06 NOTE — Telephone Encounter (Signed)
HST- [employer] Berkley Harvey: [phone] (exp. 10/28/21 to 12/26/21). Patient is scheduled at [facility] on 11/25/21 at 11 AM.

## 2021-11-06 NOTE — Telephone Encounter (Signed)
Called and spoke with pt. Relayed results per Dr. Bonnita Hollow note. She would like to go with Kesimpta. Explained loading dose/maintenance dose. Aware we will send in start form/work on PA/she will get a call from [employer] first. Then once PA approval on file, specialty pharmacy will reach out to set up shipment of med. She will call if she has any questions.

## 2021-11-13 NOTE — Telephone Encounter (Signed)
Received signed consent from pt. However, we have not received denial letter from insurance yet to complete appeal letter. I called insurance, spoke with Stacy H. He will fax denial to [phone].

## 2021-11-19 NOTE — Telephone Encounter (Addendum)
Faxed completed/signed appeal letter to [facility] appeals at [phone]. Received fax confirmation. Waiting on determination.

## 2021-11-24 NOTE — Telephone Encounter (Signed)
I called [employer]. The appeal determination for kesimpta has not been made. They have until the end of the business day on 11/25/21.

## 2021-11-25 ENCOUNTER — Other Ambulatory Visit (HOSPITAL_COMMUNITY): Payer: Self-pay

## 2021-11-25 ENCOUNTER — Ambulatory Visit: Payer: BC Managed Care – PPO | Admitting: Neurology

## 2021-11-25 DIAGNOSIS — R0683 Snoring: Secondary | ICD-10-CM

## 2021-11-25 DIAGNOSIS — G4719 Other hypersomnia: Secondary | ICD-10-CM

## 2021-11-25 DIAGNOSIS — G4710 Hypersomnia, unspecified: Secondary | ICD-10-CM

## 2021-11-26 NOTE — Progress Notes (Signed)
   [facility]  HOME SLEEP STUDY  STUDY DATE: 11/25/2021 PATIENT NAME: Shelley Simon DOB: [birth year] MRN: [id number]  ORDERING CLINICIAN: Richard A. Epimenio Foot, MD, PhD REFERRING CLINICIAN: Richard A. Sater, MD. PhD   CLINICAL INFORMATION: 21 year old woman with snoring and excessive daytime sleepiness   IMPRESSION:  This home sleep study shows: 6 hours and 25 minutes of total sleep time. Only 2.3% REM sleep No significant sleep disordered breathing with a pAHI 3% of 2.0/h Normal nocturnal oxygenation.   RECOMMENDATION: There was no obstructive sleep apnea.  If the snoring is troublesome consider the use of an oral appliance.   INTERPRETING PHYSICIAN:   Richard A. Epimenio Foot, MD, PhD, [facility] Certified in Neurology, Clinical Neurophysiology, Sleep Medicine, Pain Medicine and Neuroimaging  [facility] [address] [place], [place] [address] [phone]

## 2021-12-01 NOTE — Telephone Encounter (Signed)
I called [employer]. The appeal for kesimpta was denied. I asked that this denial letter be faxed to [place]. Once this denial is received, we can send the appeal and PA denial letter along with the provider request for patient assistance to [employer].

## 2021-12-02 ENCOUNTER — Encounter: Payer: Self-pay | Admitting: Neurology

## 2021-12-02 MED ORDER — KESIMPTA 20 MG/0.4ML ~~LOC~~ SOAJ: 20.0000 mg | SUBCUTANEOUS | 6 refills | Status: DC

## 2021-12-02 NOTE — Addendum Note (Signed)
Addended by: Geronimo Running A on: 12/02/2021 08:35 AM   Modules accepted: Orders

## 2021-12-02 NOTE — Telephone Encounter (Signed)
Faxed provider portion of NPAF for kesimpta to [employer]. Received a receipt of confirmation.

## 2021-12-03 NOTE — Telephone Encounter (Signed)
Verlon Au @ [employer] Pt assistance([phone]) states [employer] Kesimpta needs to be contacted 1st, their call back # is [phone] Website is [email or web address]

## 2021-12-03 NOTE — Telephone Encounter (Signed)
Called Kesimpta pt assistance back. Spoke w/ David/[employer]. States we have to speak with [employer] Kesim[facility] first before pt can be referred to pt assistance. He transferred me to [employer] Kesim[employer]. Spoke w/ Percell Belt.  As of 12/02/2021, they are aware PA/appeal denied and she will remain on bridge program for up to a year. Then will review benefits to make sure she still qualifies. If not, she will then be referred to pt assistance at that point. Bridge doses are dispensing to pt per [place]. Nothing further needed.

## 2021-12-18 ENCOUNTER — Encounter: Payer: Self-pay | Admitting: Neurology

## 2021-12-18 ENCOUNTER — Ambulatory Visit (INDEPENDENT_AMBULATORY_CARE_PROVIDER_SITE_OTHER): Payer: BC Managed Care – PPO | Admitting: Neurology

## 2021-12-18 VITALS — BP 154/86 | HR 84 | Ht 64.0 in | Wt 294.0 lb

## 2021-12-18 DIAGNOSIS — G35 Multiple sclerosis: Secondary | ICD-10-CM

## 2021-12-18 DIAGNOSIS — Z79899 Other long term (current) drug therapy: Secondary | ICD-10-CM | POA: Diagnosis not present

## 2021-12-18 DIAGNOSIS — R269 Unspecified abnormalities of gait and mobility: Secondary | ICD-10-CM | POA: Diagnosis not present

## 2021-12-18 DIAGNOSIS — R42 Dizziness and giddiness: Secondary | ICD-10-CM | POA: Diagnosis not present

## 2021-12-18 NOTE — Progress Notes (Signed)
[facility]  PATIENT: Shelley Simon DOB: 05-04-2001  REFERRING DOCTOR OR PCP: Johny Sax, MD; Lucianne Lei, MD SOURCE: Patient, notes from recent hospitalization, imaging and lab reports, MRI images personally reviewed.  _________________________________   HISTORICAL  CHIEF COMPLAINT:  Chief Complaint  Patient presents with   Follow-up    Rm 1, w mother. Pt here for re-eval for MS concerns. Per pt last few days her head has been feeling fuzzy and slightly dizzy. Laying down makes her sx better. More noticeable when doing stuff.     HISTORY OF PRESENT ILLNESS:  Shelley Simon is a 21 y.o. woman with MS.  Update 12/18/2021 She had the onset of vertigo a few days ago and a fuzzy headed sensation.   The left ear seems a little muffled for sounds    The vertigo is worse when she changes position and better when still for a while.  Rolling over in bed or getting up from bed has triggere worsening vertigo.   Compared to 2 days ago it is a little better.   Currently, she feels symptoms improved in the legs but she still feels off balanced and needs to use the bannister on stairs.   Her turns are wobbly.   She feels she could walk at least 200 m but is not sure she could do 1/2 mile.  This has improved though compared to 2 weeks ago.  The right hand is still clumsy - typing and texting and holding items is more difficult.  She has to hod a glass with both hands (she is right handed).    The leg numbness has improved a lot and is now minimal.  Vision is fine.  She has some anxiety.    She notes mild cognitive issues - processing mostly.   She has noted some word finding issues.   She has had more fatigue this year.     She sleeps well most night.   She snores and has some gasping / snorting but has never had a sleep study.   She has some EDS  She continues to have daytime sleepiness.  MS Hstory She presented to the [facility] emergency room [facility]/08/2021 with worsening arm  strength and numbness, right greater than left, over the last couple weeks.  She noted reduced fine motor control and reported difficulty with typing.   The day after the arms were affected she noted leg numbness.   She needd to hold on when she shampooed her hair.  The right side weakened further and the hand had more loss of control prompting an Urgent Care visit and referral that day to the ED.    In the ED, she had MRI of the brain showing about 5 T2/FLAIR hyperintense foci in the cerebral hemispheres in the periventricular and juxtacortical white matter.  MRI of the cervical and thoracic spine was also performed.  It showed a T2 hyperintense focus centrally more to the right from C3 to C4-C5 and another subtle focus at C5-C6 posterolaterally to the right..  There were no definite foci within the thoracic spine though the study was limited by motion artifact.  She was admitted and received 5 days of IV Solu-Medrol.   She noted improvement  In retrospect, she has had other episodes of hand numbness lasting a day or two.   She also had a couple episodes lasting a few weeks of gait ataxia.  She also had a couple episodes where vision was cloudy for  a few days.  She had one episode of diplopia lasting a few days.        She is otherwise fairly healthy.  She takes Cymbalta for mood and LBP.    She takes hydroxyzine prn since hospitalization for anxiety.     Imaging studies reviewed: MRI of the brain 10/09/2021 shows 5 T2/FLAIR hyperintense foci in the cerebral hemispheres, some in the periventricular white matter.  1 is in the juxtacortical white matter.  2 foci also noted in the right cerebellar hemisphere.  None of the foci enhance.  MRI of the cervical and thoracic spine 10/09/2021 showed a T2 hyperintense focus from C3 to C4-C5 towards the right and another focus posterolaterally to the right C5-C6.  There is subtle enhancement adjacent to C4 (sagittal postcontrast image 9/16).  Degenerative disc  changes are noted at C4-C5, and T8-T9.  Laboratory studies: May 2023: HIV is negative.  Hemoglobin A1c is negative.  CBC and CMP were noncontributory.  Anti-NMO IgG and anti-MOG antibody were both negative.  REVIEW OF SYSTEMS: Constitutional: No fevers, chills, sweats, or change in appetite Eyes: No visual changes, double vision, eye pain Ear, nose and throat: No hearing loss, ear pain, nasal congestion, sore throat Cardiovascular: No chest pain, palpitations Respiratory:  No shortness of breath at rest or with exertion.   No wheezes GastrointestinaI: No nausea, vomiting, diarrhea, abdominal pain, fecal incontinence Genitourinary:  No dysuria, urinary retention or frequency.  No nocturia. Musculoskeletal:  No neck pain, back pain Integumentary: No rash, pruritus, skin lesions Neurological: as above Psychiatric: No depression at this time.  No anxiety Endocrine: No palpitations, diaphoresis, change in appetite, change in weigh or increased thirst Hematologic/Lymphatic:  No anemia, purpura, petechiae. Allergic/Immunologic: No itchy/runny eyes, nasal congestion, recent allergic reactions, rashes  ALLERGIES: Allergies  Allergen Reactions   Amoxicillin Hives   Augmentin [Amoxicillin-Pot Clavulanate] Hives   Penicillins Hives   Zithromax [Azithromycin] Hives    HOME MEDICATIONS:  Current Outpatient Medications:    diphenhydrAMINE (BENADRYL) 25 MG tablet, Take 25 mg by mouth at bedtime as needed for allergies or sleep., Disp: , Rfl:    DULoxetine (CYMBALTA) 60 MG capsule, Take 60 mg by mouth at bedtime., Disp: , Rfl:    hydrOXYzine (ATARAX) 25 MG tablet, Take 1 tablet (25 mg total) by mouth 3 (three) times daily as needed for anxiety., Disp: 30 tablet, Rfl: 0   ibuprofen (ADVIL) 200 MG tablet, Take 800 mg by mouth every 6 (six) hours as needed for headache or moderate pain., Disp: , Rfl:    Ofatumumab (KESIMPTA) 20 MG/0.4ML SOAJ, Inject 20 mg into the skin every 30 (thirty) days. 1 SQ  injection at week 0,1,and 2. 1 SQ injection monthly starting at week 4., Disp: 0.4 mL, Rfl: 6   Vitamin D, Ergocalciferol, (DRISDOL) 1.25 MG (50000 UNIT) CAPS capsule, Take 50,000 Units by mouth every Monday., Disp: , Rfl:   PAST MEDICAL HISTORY: Past Medical History:  Diagnosis Date   Asthma    Multiple sclerosis ([place])     PAST SURGICAL HISTORY: Past Surgical History:  Procedure Laterality Date   TONSILLECTOMY  2005    FAMILY HISTORY: Family History  Problem Relation Age of Onset   Thyroid disease Mother    Migraines Mother    Asthma Brother     SOCIAL HISTORY:  Social History   Socioeconomic History   Marital status: Single    Spouse name: Not on file   Number of children: 0   Years of education:  Not on file   Highest education level: High school graduate  Occupational History   Not on file  Tobacco Use   Smoking status: Never   Smokeless tobacco: Never  Vaping Use   Vaping Use: Never used  Substance and Sexual Activity   Alcohol use: Never   Drug use: Never   Sexual activity: Not on file  Other Topics Concern   Not on file  Social History Narrative   Lives with mother   R handed   Caffeine: 1 soda a day   Social Determinants of Health   Financial Resource Strain: Not on file  Food Insecurity: Not on file  Transportation Needs: Not on file  Physical Activity: Not on file  Stress: Not on file  Social Connections: Not on file  Intimate Partner Violence: Not on file     PHYSICAL EXAM  Vitals:   12/18/21 1352  BP: (!) 154/86  Pulse: 84  Weight: 294 lb (133.4 kg)  Height: 5\' 4"  (1.626 m)    Body mass index is 50.46 kg/m.   General: The patient is well-developed and well-nourished and in no acute distress.   Pharynx is Mallmapati 2.    HEENT:  Head is [place]/AT.  Sclera are anicteric.   She has wax in her ear canals bilaterally and I cannot see the tympanic membranes.  Neck: No carotid bruits are noted.  The neck is nontender.   Skin:  Extremities are without rash or  edema.  Musculoskeletal:  Back is nontender  Neurologic Exam  Mental status: The patient is alert and oriented x 3 at the time of the examination. The patient has apparent normal recent and remote memory, with an apparently normal attention span and concentration ability.   Speech is normal.  Cranial nerves: Extraocular movements are full. Pupils ashow 1+ right APD and she has reduced color vision OD.  Visual fields are full.  Facial symmetry is present. There is good facial sensation to soft touch bilaterally.Facial strength is normal.  Trapezius and sternocleidomastoid strength is normal. No dysarthria is noted.  The tongue is midline, and the patient has symmetric elevation of the soft palate.  Hearing is reduced on the right and the Weber lateralizes  Motor:  Muscle bulk is normal.   Tone is normal. Strength is  5 / 5 in left arm and both legs but 4+/5 intrinsic hand muscles on the right.  Left rotating movements are mildly reduced in the right arm.  Sensory: Sensory testing is intact to pinprick, soft touch sensation in all 4 extremities.   She has reduced vibration sensation but preserved position sensation on the right.   Coordination: Cerebellar testing reveals good finger-nose-finger and heel-to-shin bilaterally.  Gait and station: Station is normal.   Gait is slightly wide. Tandem gait is wide. Romberg is negative.   Reflexes: Deep tendon reflexes are symmetric and normal bilaterally.   Plantar responses are flexor.  Other: Dix-Hallpike maneuver did not to  nystagmus though did produce mild vertigo    DIAGNOSTIC DATA (LABS, IMAGING, TESTING) - I reviewed patient records, labs, notes, testing and imaging myself where available.  Lab Results  Component Value Date   WBC 9.5 10/14/2021   HGB 11.5 (L) 10/14/2021   HCT 34.7 (L) 10/14/2021   MCV 86.8 10/14/2021   PLT 398 10/14/2021      Component Value Date/Time   NA 140 10/13/2021 0154   K  3.5 10/13/2021 0154   CL 106 10/13/2021 0154   CO2  29 10/13/2021 0154   GLUCOSE 141 (H) 10/13/2021 0154   BUN 14 10/13/2021 0154   CREATININE 0.78 10/13/2021 0154   CALCIUM 8.6 (L) 10/13/2021 0154   PROT 7.7 10/09/2021 1635   ALBUMIN 3.5 10/09/2021 1635   AST 15 10/09/2021 1635   ALT 13 10/09/2021 1635   ALKPHOS 99 10/09/2021 1635   BILITOT 0.5 10/09/2021 1635   GFRNONAA >60 10/13/2021 0154   Lab Results  Component Value Date   CHOL 171 10/11/2021   HDL 45 10/11/2021   LDLCALC 118 (H) 10/11/2021   TRIG 40 10/11/2021   CHOLHDL 3.8 10/11/2021   Lab Results  Component Value Date   HGBA1C 4.7 (L) 10/10/2021   No results found for: "VITAMINB12" Lab Results  Component Value Date   TSH 2.722 10/09/2021       ASSESSMENT AND PLAN  Multiple sclerosis (HCC)  High risk medication use  Vertigo  Gait disturbance  1.  She will continue Kesimpta. 2.  She appears to have a peripheral vertigo, possibly caused by the occluded ear canals.  She is advised to get over-the-counter earwax dissolving solution.  If that does not help she should see ENT. 3.  Stay active and exercise as tolerated. 4.  Follow-up in 4 months or sooner if there are new or worsening neurologic symptoms.  Rayetta Veith A. Felecia Shelling, MD, [facility] [id number], [id number] PM Certified in Neurology, Clinical Neurophysiology, Sleep Medicine and Neuroimaging  [facility] [address], [place], [place] [address] [phone]

## 2022-01-28 DIAGNOSIS — G35 Multiple sclerosis: Secondary | ICD-10-CM | POA: Diagnosis not present

## 2022-01-28 DIAGNOSIS — F419 Anxiety disorder, unspecified: Secondary | ICD-10-CM | POA: Diagnosis not present

## 2022-01-28 DIAGNOSIS — Z6837 Body mass index (BMI) 37.0-37.9, adult: Secondary | ICD-10-CM | POA: Diagnosis not present

## 2022-01-28 DIAGNOSIS — R0683 Snoring: Secondary | ICD-10-CM | POA: Diagnosis not present

## 2022-05-13 ENCOUNTER — Telehealth: Payer: Self-pay | Admitting: Neurology

## 2022-05-13 ENCOUNTER — Ambulatory Visit: Payer: BC Managed Care – PPO | Admitting: Neurology

## 2022-05-13 NOTE — Telephone Encounter (Signed)
..   Pt understands that although there may be some limitations with this type of visit, we will take all precautions to reduce any security or privacy concerns.  Pt understands that this will be treated like an in office visit and we will file with pt's insurance, and there may be a patient responsible charge related to this service. ? ?

## 2022-05-25 ENCOUNTER — Encounter: Payer: Self-pay | Admitting: Neurology

## 2022-05-25 ENCOUNTER — Telehealth (INDEPENDENT_AMBULATORY_CARE_PROVIDER_SITE_OTHER): Payer: BC Managed Care – PPO | Admitting: Neurology

## 2022-05-25 DIAGNOSIS — R42 Dizziness and giddiness: Secondary | ICD-10-CM

## 2022-05-25 DIAGNOSIS — Z79899 Other long term (current) drug therapy: Secondary | ICD-10-CM | POA: Diagnosis not present

## 2022-05-25 DIAGNOSIS — G4719 Other hypersomnia: Secondary | ICD-10-CM | POA: Diagnosis not present

## 2022-05-25 DIAGNOSIS — G35 Multiple sclerosis: Secondary | ICD-10-CM

## 2022-05-25 DIAGNOSIS — R269 Unspecified abnormalities of gait and mobility: Secondary | ICD-10-CM

## 2022-05-25 MED ORDER — HYDROXYZINE HCL 25 MG PO TABS: 25.0000 mg | ORAL_TABLET | Freq: Three times a day (TID) | ORAL | 5 refills | Status: AC | PRN

## 2022-05-25 MED ORDER — DALFAMPRIDINE ER 10 MG PO TB12: ORAL_TABLET | ORAL | 5 refills | Status: AC

## 2022-05-25 NOTE — Progress Notes (Signed)
[facility]  PATIENT: Shelley Simon DOB: [birth year]  REFERRING DOCTOR OR PCP: Bobby Rumpf, MD; Nicholos Johns, MD SOURCE: Patient, notes from recent hospitalization, imaging and lab reports, MRI images personally reviewed.  _________________________________   HISTORICAL  CHIEF COMPLAINT:  Chief Complaint  Patient presents with   Multiple Sclerosis    HISTORY OF PRESENT ILLNESS:  Shelley Simon is a 23 y.o. woman with MS.  Virtual Visit via Video Note I connected with Shelley Simon  05/25/22 at  1:30 PM EST by a video enabled telemedicine application and verified that I am speaking with the correct person.  I discussed the limitations of evaluation and management by telemedicine and the availability of in person appointments. The patient expressed understanding and agreed to proceed.    Update 05/25/2022 She started Kesimpta and is tolerating it well.  She is doing well giving the shots.  She is noting shaking of her hands.  She still has vertigo now and then.  She has trouble walking a straight line.    For the most part, symptoms are similar to a few months ago  Gait is off balanced since May.  She needs to use the bannister on stairs.   Her turns are wobbly.   She feels she could walk  200 m but cannot do 1/2 mile. She only had mild improvement after the 2023 exacerbation  The right hand is still clumsy - typing and texting and holding items is more difficult.  She has to hod a glass with both hands (she is right handed).    The leg numbness has improved a lot and is now minimal.  Vision is fine.  She notes mild cognitive issues - processing mostly.   She has noted some word finding issues.   Focus is reduced.   She has had more fatigue this year.    She is sometimes moody. The duloxetine was switched to sertraline 100 mg.   She sleeps well only some  night.   She snores but had no OSA on HST.   She continues to have daytime sleepiness.  MS Hstory She  presented to the [facility] emergency room 10/08/2021 with worsening arm strength and numbness, right greater than left, over the last couple weeks.  She noted reduced fine motor control and reported difficulty with typing.   The day after the arms were affected she noted leg numbness.   She needd to hold on when she shampooed her hair.  The right side weakened further and the hand had more loss of control prompting an Urgent Care visit and referral that day to the ED.    In the ED, she had MRI of the brain showing about 5 T2/FLAIR hyperintense foci in the cerebral hemispheres in the periventricular and juxtacortical white matter.  MRI of the cervical and thoracic spine was also performed.  It showed a T2 hyperintense focus centrally more to the right from C3 to C4-C5 and another subtle focus at C5-C6 posterolaterally to the right..  There were no definite foci within the thoracic spine though the study was limited by motion artifact.  She was admitted and received 5 days of IV Solu-Medrol.   She noted improvement  In retrospect, she has had other episodes of hand numbness lasting a day or two.   She also had a couple episodes lasting a few weeks of gait ataxia.  She also had a couple episodes where vision was cloudy for a few days.  She had one episode of diplopia lasting a few days.          Imaging studies reviewed: MRI of the brain 10/09/2021 shows 5 T2/FLAIR hyperintense foci in the cerebral hemispheres, some in the periventricular white matter.  1 is in the juxtacortical white matter.  2 foci also noted in the right cerebellar hemisphere.  None of the foci enhance.  MRI of the cervical and thoracic spine 10/09/2021 showed a T2 hyperintense focus from C3 to C4-C5 towards the right and another focus posterolaterally to the right C5-C6.  There is subtle enhancement adjacent to C4 (sagittal postcontrast image 9/16).  Degenerative disc changes are noted at C4-C5, and T8-T9.  Laboratory studies: May 2023:  HIV is negative.  Hemoglobin A1c is negative.  CBC and CMP were noncontributory.  Anti-NMO IgG and anti-MOG antibody were both negative.  REVIEW OF SYSTEMS: Constitutional: No fevers, chills, sweats, or change in appetite Eyes: No visual changes, double vision, eye pain Ear, nose and throat: No hearing loss, ear pain, nasal congestion, sore throat Cardiovascular: No chest pain, palpitations Respiratory:  No shortness of breath at rest or with exertion.   No wheezes GastrointestinaI: No nausea, vomiting, diarrhea, abdominal pain, fecal incontinence Genitourinary:  No dysuria, urinary retention or frequency.  No nocturia. Musculoskeletal:  No neck pain, back pain Integumentary: No rash, pruritus, skin lesions Neurological: as above Psychiatric: No depression at this time.  No anxiety Endocrine: No palpitations, diaphoresis, change in appetite, change in weigh or increased thirst Hematologic/Lymphatic:  No anemia, purpura, petechiae. Allergic/Immunologic: No itchy/runny eyes, nasal congestion, recent allergic reactions, rashes  ALLERGIES: Allergies  Allergen Reactions   Amoxicillin Hives   Augmentin [Amoxicillin-Pot Clavulanate] Hives   Penicillins Hives   Zithromax [Azithromycin] Hives    HOME MEDICATIONS:  Current Outpatient Medications:    dalfampridine 10 MG TB12, One po q12 hours, Disp: 60 tablet, Rfl: 5   diphenhydrAMINE (BENADRYL) 25 MG tablet, Take 25 mg by mouth at bedtime as needed for allergies or sleep., Disp: , Rfl:    DULoxetine (CYMBALTA) 60 MG capsule, Take 60 mg by mouth at bedtime., Disp: , Rfl:    hydrOXYzine (ATARAX) 25 MG tablet, Take 1 tablet (25 mg total) by mouth 3 (three) times daily as needed for anxiety., Disp: 90 tablet, Rfl: 5   ibuprofen (ADVIL) 200 MG tablet, Take 800 mg by mouth every 6 (six) hours as needed for headache or moderate pain., Disp: , Rfl:    Ofatumumab (KESIMPTA) 20 MG/0.4ML SOAJ, Inject 20 mg into the skin every 30 (thirty) days. 1 SQ  injection at week 0,1,and 2. 1 SQ injection monthly starting at week 4., Disp: 0.4 mL, Rfl: 6   Vitamin D, Ergocalciferol, (DRISDOL) 1.25 MG (50000 UNIT) CAPS capsule, Take 50,000 Units by mouth every Monday., Disp: , Rfl:   PAST MEDICAL HISTORY: Past Medical History:  Diagnosis Date   Asthma    Multiple sclerosis (HCC)     PAST SURGICAL HISTORY: Past Surgical History:  Procedure Laterality Date   TONSILLECTOMY  2005    FAMILY HISTORY: Family History  Problem Relation Age of Onset   Thyroid disease Mother    Migraines Mother    Asthma Brother     SOCIAL HISTORY:  Social History   Socioeconomic History   Marital status: Single    Spouse name: Not on file   Number of children: 0   Years of education: Not on file   Highest education level: High school graduate  Occupational History  Not on file  Tobacco Use   Smoking status: Never   Smokeless tobacco: Never  Vaping Use   Vaping Use: Never used  Substance and Sexual Activity   Alcohol use: Never   Drug use: Never   Sexual activity: Not on file  Other Topics Concern   Not on file  Social History Narrative   Lives with mother   R handed   Caffeine: 1 soda a day   Social Determinants of Health   Financial Resource Strain: Not on file  Food Insecurity: Not on file  Transportation Needs: Not on file  Physical Activity: Not on file  Stress: Not on file  Social Connections: Not on file  Intimate Partner Violence: Not on file     PHYSICAL EXAM from 09/2021  There were no vitals filed for this visit.   There is no height or weight on file to calculate BMI.   General: The patient is well-developed and well-nourished and in no acute distress.      HEENT:  Head is [place]/AT.  Sclera are anicteric.   She has wax in her ear canals bilaterally and I cannot see the tympanic membranes.  Neck: No carotid bruits are noted.  The neck is nontender.   Skin: Extremities are without rash or  edema.  Musculoskeletal:   Back is nontender  Neurologic Exam  Mental status: The patient is alert and oriented x 3 at the time of the examination. The patient has apparent normal recent and remote memory, with an apparently normal attention span and concentration ability.   Speech is normal.  Cranial nerves: Extraocular movements are full. Pupils ashow 1+ right APD and she has reduced color vision OD.  Visual fields are full.  Facial symmetry is present. There is good facial sensation to soft touch bilaterally.Facial strength is normal.  Trapezius and sternocleidomastoid strength is normal. No dysarthria is noted.  The tongue is midline, and the patient has symmetric elevation of the soft palate.  Hearing is reduced on the right and the Weber lateralizes  Motor:  Muscle bulk is normal.   Tone is normal. Strength is  5 / 5 in left arm and both legs but 4+/5 intrinsic hand muscles on the right.  Left rotating movements are mildly reduced in the right arm.  Sensory: Sensory testing is intact to pinprick, soft touch sensation in all 4 extremities.   She has reduced vibration sensation but preserved position sensation on the right.   Coordination: Cerebellar testing reveals good finger-nose-finger and heel-to-shin bilaterally.  Gait and station: Station is normal.   Gait is slightly wide. Tandem gait is wide. Romberg is negative.   Reflexes: Deep tendon reflexes are symmetric and normal bilaterally.   Plantar responses are flexor.  Other: Dix-Hallpike maneuver did not to  nystagmus though did produce mild vertigo    DIAGNOSTIC DATA (LABS, IMAGING, TESTING) - I reviewed patient records, labs, notes, testing and imaging myself where available.  Lab Results  Component Value Date   WBC 9.5 10/14/2021   HGB 11.5 (L) 10/14/2021   HCT 34.7 (L) 10/14/2021   MCV 86.8 10/14/2021   PLT 398 10/14/2021      Component Value Date/Time   NA 140 10/13/2021 0154   K 3.5 10/13/2021 0154   CL 106 10/13/2021 0154   CO2 29  10/13/2021 0154   GLUCOSE 141 (H) 10/13/2021 0154   BUN 14 10/13/2021 0154   CREATININE 0.78 10/13/2021 0154   CALCIUM 8.6 (L) 10/13/2021 0154  PROT 7.7 10/09/2021 1635   ALBUMIN 3.5 10/09/2021 1635   AST 15 10/09/2021 1635   ALT 13 10/09/2021 1635   ALKPHOS 99 10/09/2021 1635   BILITOT 0.5 10/09/2021 1635   GFRNONAA >60 10/13/2021 0154   Lab Results  Component Value Date   CHOL 171 10/11/2021   HDL 45 10/11/2021   LDLCALC 118 (H) 10/11/2021   TRIG 40 10/11/2021   CHOLHDL 3.8 10/11/2021   Lab Results  Component Value Date   HGBA1C 4.7 (L) 10/10/2021   No results found for: "VITAMINB12" Lab Results  Component Value Date   TSH 2.722 10/09/2021       ASSESSMENT AND PLAN  Multiple sclerosis (HCC) - Plan: MR BRAIN W WO CONTRAST, MR CERVICAL SPINE W WO CONTRAST  High risk medication use - Plan: MR BRAIN W WO CONTRAST, MR CERVICAL SPINE W WO CONTRAST  Gait disturbance  Vertigo  Excessive daytime sleepiness  1.  She will continue Kesimpta. 2.  Trial of Ampyra for reduced gait. 3.  Stay active and exercise as tolerated. 4.  Follow-up in 4 months or sooner if there are new or worsening neurologic symptoms.   Follow Up Instructions: I discussed the assessment and treatment plan with the patient. The patient was provided an opportunity to ask questions and all were answered. The patient agreed with the plan and demonstrated an understanding of the instructions.    The patient was advised to call back or seek an in-person evaluation if the symptoms worsen or if the condition fails to improve as anticipated.  I provided 28 minutes of non-face-to-face time during this encounter.   Julyanna Scholle A. Epimenio Foot, MD, [facility] 05/25/2022, 8:20 PM Certified in Neurology, Clinical Neurophysiology, Sleep Medicine and Neuroimaging  [facility] [address] [place], [place] [address] [phone]

## 2022-05-26 ENCOUNTER — Telehealth: Payer: Self-pay | Admitting: Neurology

## 2022-05-26 NOTE — Telephone Encounter (Signed)
[employer] NPR via [employer] sent to GI [phone] 

## 2022-06-19 ENCOUNTER — Ambulatory Visit
Admission: RE | Admit: 2022-06-19 | Discharge: 2022-06-19 | Disposition: A | Discharge status: Home / Self Care | Payer: BC Managed Care – PPO | Source: Ambulatory Visit | Attending: Neurology | Admitting: Neurology

## 2022-06-19 DIAGNOSIS — Z79899 Other long term (current) drug therapy: Secondary | ICD-10-CM

## 2022-06-19 DIAGNOSIS — G35 Multiple sclerosis: Secondary | ICD-10-CM | POA: Diagnosis not present

## 2022-06-19 MED ORDER — GADOPICLENOL 0.5 MMOL/ML IV SOLN
10.0000 mL | Freq: Once | INTRAVENOUS | Status: AC | PRN
  Administered 2022-06-19: 10 mL via INTRAVENOUS

## 2022-06-22 ENCOUNTER — Other Ambulatory Visit: Payer: Self-pay | Admitting: Neurology

## 2022-06-22 ENCOUNTER — Other Ambulatory Visit (HOSPITAL_COMMUNITY): Payer: Self-pay

## 2022-06-22 DIAGNOSIS — R269 Unspecified abnormalities of gait and mobility: Secondary | ICD-10-CM

## 2022-06-22 DIAGNOSIS — G35 Multiple sclerosis: Secondary | ICD-10-CM

## 2022-06-22 DIAGNOSIS — R42 Dizziness and giddiness: Secondary | ICD-10-CM

## 2022-06-22 DIAGNOSIS — Z79899 Other long term (current) drug therapy: Secondary | ICD-10-CM

## 2022-06-22 DIAGNOSIS — G4719 Other hypersomnia: Secondary | ICD-10-CM

## 2022-06-25 DIAGNOSIS — Z6837 Body mass index (BMI) 37.0-37.9, adult: Secondary | ICD-10-CM | POA: Diagnosis not present

## 2022-06-25 DIAGNOSIS — Z7960 Long term (current) use of unspecified immunomodulators and immunosuppressants: Secondary | ICD-10-CM | POA: Diagnosis not present

## 2022-06-25 DIAGNOSIS — E785 Hyperlipidemia, unspecified: Secondary | ICD-10-CM | POA: Diagnosis not present

## 2022-06-25 DIAGNOSIS — Z0000 Encounter for general adult medical examination without abnormal findings: Secondary | ICD-10-CM | POA: Diagnosis not present

## 2022-06-25 DIAGNOSIS — E559 Vitamin D deficiency, unspecified: Secondary | ICD-10-CM | POA: Diagnosis not present

## 2022-07-07 ENCOUNTER — Telehealth: Payer: Self-pay | Admitting: *Deleted

## 2022-07-07 NOTE — Telephone Encounter (Signed)
Submitted PA Kesimpta on covermymeds. Key: [id number]. Waiting on determination from  [facility].

## 2022-07-08 NOTE — Telephone Encounter (Signed)
Faxed back completed/signed additional clinical info that was requested back to [facility] at [phone]. Received fax confirmation.

## 2022-07-16 NOTE — Telephone Encounter (Signed)
PA denied. Appeal letter written, waiting on MD signature. Emailed consent form to pt to sign and fax back. Sent mychart to pt.

## 2022-07-28 NOTE — Telephone Encounter (Signed)
Per Milon Dikes, she just called and LVM for pt to send back signed consent.

## 2022-07-30 NOTE — Telephone Encounter (Signed)
Pt provided signed consent form. I faxed appeal to [facility] appeals at [phone]. Marked urgent, waiting on determination.

## 2022-08-17 NOTE — Telephone Encounter (Addendum)
Received notification that pt has not read mychart message sent on 08/03/22.   I called [facility] to see if pt screened for free drug. Spoke w/ Glennis Brink. Confirmed they received PA/appeal denial. She is also a bridge pt. Once she exhausts refills on bridge, they will refer to [employer] free drug. They have not reached out to pt with this information.   Called pt at [phone]. This was her mothers phone number. I left detailed message letting her know PA/appeal denied. She will be getting med from [facility] until June 2024. After that, she will be referred to [employer] free drug program to get her medication. Provided [facility] phone# [phone] if they need to reach them for any reason. Asked for them to call back if they have any questions.

## 2022-09-23 DIAGNOSIS — G35 Multiple sclerosis: Secondary | ICD-10-CM | POA: Diagnosis not present

## 2022-09-23 DIAGNOSIS — E559 Vitamin D deficiency, unspecified: Secondary | ICD-10-CM | POA: Diagnosis not present

## 2022-09-23 DIAGNOSIS — F419 Anxiety disorder, unspecified: Secondary | ICD-10-CM | POA: Diagnosis not present

## 2022-09-23 DIAGNOSIS — Z6837 Body mass index (BMI) 37.0-37.9, adult: Secondary | ICD-10-CM | POA: Diagnosis not present

## 2022-11-09 ENCOUNTER — Other Ambulatory Visit: Payer: Self-pay | Admitting: Neurology

## 2022-11-10 ENCOUNTER — Telehealth: Payer: Self-pay | Admitting: Neurology

## 2022-11-10 NOTE — Telephone Encounter (Signed)
Note from 08/17/22: "I called [facility] to see if you had been screened for free drug since the PA/appeal were denied by your insurance. Spoke w/ Moishe Spice. They confirmed they received PA/appeal denial we faxed them. They told me you have a bridge prescription to get Kesimpta from them and this should go through June 2024. Once this is up, they will transition you to the [employer] free drug program. If you have any more questions, you can call [facility] phone# [phone] and they can also go through this with you."   I called [facility] and spoke w/ Fleet Contras. [facility] Referral ([employer] free drug program) going through on Thursday (11/12/22) (Bridge rx will expire that day). Cannot send referral until Bridge prescription expires. Takes 24 hours to process referral and then will call pt to go over info. Once this goes through, she can start getting rx from [employer] free drug program.   If she is 5 days out from when next dose of Kesimpta due, she can call [facility] for temp dose if [employer] free drug program will not get drug to her on time. Per their records, she received last dose 10/19/22. Unsure when she took dose.   I called pt at [phone]. Spoke w/ mother (on [place]). Relayed above info. They confirmed they received 10/19/22 Kesimpta shipment. They will wait on phone call from [employer] pt assistance.

## 2022-11-10 NOTE — Telephone Encounter (Signed)
Pt checking on on refill for Ofatumumab (KESIMPTA) 20 MG/0.4ML SOAJ

## 2022-11-12 NOTE — Telephone Encounter (Signed)
Faxed 11/12/2022 @ 7:22am to [facility] Patient [facility] [phone]

## 2022-11-13 ENCOUNTER — Encounter: Payer: Self-pay | Admitting: Neurology

## 2022-12-03 ENCOUNTER — Telehealth: Payer: Self-pay

## 2022-12-03 NOTE — Telephone Encounter (Signed)
Called pt and let her know that her Kesimpta was Approved until 05/18/2023 at no cost to her.  Pt ID #: [id number]

## 2022-12-07 ENCOUNTER — Telehealth: Payer: Self-pay | Admitting: Neurology

## 2022-12-07 ENCOUNTER — Other Ambulatory Visit: Payer: Self-pay

## 2022-12-07 NOTE — Telephone Encounter (Signed)
[employer]Meds ([facility]) request refill for KESIMPTA 20 MG/0.4ML SOAJ

## 2022-12-08 NOTE — Telephone Encounter (Signed)
[facility] is not a pharmacy we have on file. CoverMyMeds provides prior authorizations for medications. Could you please call and clarify what orders they need? Is there a prescription form we need to send to them? If so, please have them fax the form. Thank you!

## 2022-12-10 ENCOUNTER — Telehealth: Payer: Self-pay | Admitting: Neurology

## 2022-12-10 MED ORDER — KESIMPTA 20 MG/0.4ML ~~LOC~~ SOAJ: SUBCUTANEOUS | 7 refills | Status: DC

## 2022-12-10 NOTE — Telephone Encounter (Signed)
Pt's mother called saying that [facility] informed her that the Rx that was sent was out of date for the pt's  KESIMPTA 20 MG/0.4ML SOAJ and another one needs to be sent. Please advise.

## 2022-12-10 NOTE — Telephone Encounter (Addendum)
I spoke with [facility]. I was told they filled Kesimpta on 7/2 and then Rx was triaged to [employer] Patient Assistance Program pharmacy since pt was approved for free drug through the program. [employer] should automatically call patient but if they haven't called then pt can call [phone].  I spoke with the patient's mother. She said [employer] had been in touch and said the Rx was expired. She will call them back. I told her I would call too.   I spoke with [facility]. They confirmed with the pharmacy that apparently the prescription had expired. We can e-scribe to [facility] in [place]. I have sent the prescription. I sent pt an update via mychart.

## 2022-12-14 NOTE — Telephone Encounter (Signed)
Rx filled on 12/10/22.

## 2022-12-15 NOTE — Telephone Encounter (Signed)
I called and spoke to [employer] PAP FDN. [phone].  Janine Limbo, stated that they do have updated prescription. [facility] [place].  Pt has been in touch about Kesimpta refill.

## 2022-12-20 ENCOUNTER — Encounter: Payer: Self-pay | Admitting: Neurology

## 2022-12-24 ENCOUNTER — Encounter: Payer: Self-pay | Admitting: Neurology

## 2022-12-24 ENCOUNTER — Ambulatory Visit (INDEPENDENT_AMBULATORY_CARE_PROVIDER_SITE_OTHER): Payer: BC Managed Care – PPO | Admitting: Neurology

## 2022-12-24 VITALS — BP 127/84 | HR 86 | Ht 65.0 in | Wt 344.5 lb

## 2022-12-24 DIAGNOSIS — Z79899 Other long term (current) drug therapy: Secondary | ICD-10-CM | POA: Diagnosis not present

## 2022-12-24 DIAGNOSIS — R200 Anesthesia of skin: Secondary | ICD-10-CM

## 2022-12-24 DIAGNOSIS — R519 Headache, unspecified: Secondary | ICD-10-CM | POA: Diagnosis not present

## 2022-12-24 DIAGNOSIS — G35 Multiple sclerosis: Secondary | ICD-10-CM | POA: Diagnosis not present

## 2022-12-24 DIAGNOSIS — G5602 Carpal tunnel syndrome, left upper limb: Secondary | ICD-10-CM

## 2022-12-24 DIAGNOSIS — R269 Unspecified abnormalities of gait and mobility: Secondary | ICD-10-CM

## 2022-12-24 DIAGNOSIS — M542 Cervicalgia: Secondary | ICD-10-CM

## 2022-12-24 MED ORDER — LAMOTRIGINE 100 MG PO TABS: ORAL_TABLET | ORAL | 5 refills | Status: DC

## 2022-12-24 NOTE — Patient Instructions (Addendum)
LAMOTRIGINE 100 mg   Take 1/2 pill daily x 1 week,  Then 1/2 pill twice daily x 1 week,  Then 1/2 pill in am and one pill at night x 1 week, Then 2 pills twice daily   If a rash develops then stop   Obtain a wrist splint for the left carpal tunnel syndrome   Take 4000-5000 U Vit D daily

## 2022-12-24 NOTE — Progress Notes (Addendum)
[facility]  PATIENT: Shelley Simon DOB: July 05, 2000  REFERRING DOCTOR OR PCP: Johny Sax, MD; Lucianne Lei, MD SOURCE: Patient, notes from recent hospitalization, imaging and lab reports, MRI images personally reviewed.  _________________________________   HISTORICAL  CHIEF COMPLAINT:  Chief Complaint  Patient presents with   Room 11    Pt is here with her Mother. Pt states that she is pressure in her head when she has a headache. Pt states that she is having pain in left hand that shoots up her wrist and causes numbness.     HISTORY OF PRESENT ILLNESS:  Shelley Simon is a 22 y.o. woman with MS.  Update 12/24/2022 She is on  Kesimpta (July 2023) and is tolerating it well.  She is doing well giving the shots.    Gait is off balanced and wide.  She needs to use the bannister on stairs.   Her turns are wobbly.   She can walk 500 feet independently.   The right hand is still clumsy - typing and texting and holding items is more difficult.  She has to hod a glass with both hands (she is right handed).    The leg numbness has improved a lot and is now minimal.  She has left hand numbness wrist down, sometimes awakening her.    Vision is fine.  She notes mild cognitive issues - processing mostly.   She has noted some word finding issues.   Focus is reduced.   She has had more fatigue this year.    She is mildly depressed and is on sertraline 100 mg.   She is irritable.   She gets agitated at times and fidgets her leg a lot.     She sleeps well only some  night.   She snores but had no OSA on HST.   She continues to have daytime sleepiness.  She has had a left sided HA that is daily but minimal in the morning but worsens as day goes on, sometimes with migrainous features like photophobia.   Pain is worse with movements.   Take vit D 4000-5000 daily  MS Hstory She presented to the [facility] room 10/08/2021 with worsening arm strength and numbness, right  greater than left, over the last couple weeks.  She noted reduced fine motor control and reported difficulty with typing.   The day after the arms were affected she noted leg numbness.   She needd to hold on when she shampooed her hair.  The right side weakened further and the hand had more loss of control prompting an Urgent Care visit and referral that day to the ED.    In the ED, she had MRI of the brain showing about 5 T2/FLAIR hyperintense foci in the cerebral hemispheres in the periventricular and juxtacortical white matter.  MRI of the cervical and thoracic spine was also performed.  It showed a T2 hyperintense focus centrally more to the right from C3 to C4-C5 and another subtle focus at C5-C6 posterolaterally to the right..  There were no definite foci within the thoracic spine though the study was limited by motion artifact.  She was admitted and received 5 days of IV Solu-Medrol.   She noted improvement  In retrospect, she has had other episodes of hand numbness lasting a day or two.   She also had a couple episodes lasting a few weeks of gait ataxia.  She also had a couple episodes where vision was cloudy for  a few days.  She had one episode of diplopia lasting a few days.          Imaging studies reviewed: MRI of the brain 10/09/2021 shows 5 T2/FLAIR hyperintense foci in the cerebral hemispheres, some in the periventricular white matter.  1 is in the juxtacortical white matter.  2 foci also noted in the right cerebellar hemisphere.  None of the foci enhance.  MRI of the cervical and thoracic spine 10/09/2021 showed a T2 hyperintense focus from C3 to C4-C5 towards the right and another focus posterolaterally to the right C5-C6.  There is subtle enhancement adjacent to C4 (sagittal postcontrast image 9/16).  Degenerative disc changes are noted at C4-C5, and T8-T9.  Laboratory studies: May 2023: HIV is negative.  Hemoglobin A1c is negative.  CBC and CMP were noncontributory.  Anti-NMO IgG and  anti-MOG antibody were both negative.  REVIEW OF SYSTEMS:  REVIEW OF SYSTEMS: Constitutional: No fevers, chills, sweats, or change in appetite Eyes: No visual changes, double vision, eye pain Ear, nose and throat: No hearing loss, ear pain, nasal congestion, sore throat Cardiovascular: No chest pain, palpitations Respiratory:  No shortness of breath at rest or with exertion.   No wheezes GastrointestinaI: No nausea, vomiting, diarrhea, abdominal pain, fecal incontinence Genitourinary:  No dysuria, urinary retention or frequency.  No nocturia. Musculoskeletal:  No neck pain, back pain Integumentary: No rash, pruritus, skin lesions Neurological: as above Psychiatric: No depression at this time.  No anxiety Endocrine: No palpitations, diaphoresis, change in appetite, change in weigh or increased thirst Hematologic/Lymphatic:  No anemia, purpura, petechiae. Allergic/Immunologic: No itchy/runny eyes, nasal congestion, recent allergic reactions, rashes  ALLERGIES: Allergies  Allergen Reactions   Amoxicillin Hives   Augmentin [Amoxicillin-Pot Clavulanate] Hives   Penicillins Hives   Zithromax [Azithromycin] Hives    HOME MEDICATIONS:  Current Outpatient Medications:    diphenhydrAMINE (BENADRYL) 25 MG tablet, Take 25 mg by mouth at bedtime as needed for allergies or sleep., Disp: , Rfl:    hydrOXYzine (ATARAX) 25 MG tablet, Take 1 tablet (25 mg total) by mouth 3 (three) times daily as needed for anxiety., Disp: 90 tablet, Rfl: 5   ibuprofen (ADVIL) 200 MG tablet, Take 800 mg by mouth every 6 (six) hours as needed for headache or moderate pain., Disp: , Rfl:    KESIMPTA 20 MG/0.4ML SOAJ, INJECT 1 PEN (20MG ) SUBCUTANEOUSLY MONTHLY STARTING AT WEEK 4 AS DIRECTED, Disp: 0.4 mL, Rfl: 7   lamoTRIgine (LAMICTAL) 100 MG tablet, Take 1/2 pill daily x 1 week, then 1/2 pill twice daily x 1 week, then 1/2 pill in am and one pill at night x 1 week, then 2 pills twice daily, Disp: 60 tablet, Rfl: 5    sertraline (ZOLOFT) 100 MG tablet, Take 100 mg by mouth daily., Disp: , Rfl:    Vitamin D, Ergocalciferol, (DRISDOL) 1.25 MG (50000 UNIT) CAPS capsule, Take 50,000 Units by mouth every Monday., Disp: , Rfl:    dalfampridine 10 MG TB12, One po q12 hours (Patient not taking: Reported on 12/24/2022), Disp: 60 tablet, Rfl: 5  PAST MEDICAL HISTORY: Past Medical History:  Diagnosis Date   Asthma    Multiple sclerosis (HCC)     PAST SURGICAL HISTORY: Past Surgical History:  Procedure Laterality Date   TONSILLECTOMY  2005    FAMILY HISTORY: Family History  Problem Relation Age of Onset   Thyroid disease Mother    Migraines Mother    Asthma Brother     SOCIAL HISTORY:  Social  History   Socioeconomic History   Marital status: Single    Spouse name: Not on file   Number of children: 0   Years of education: Not on file   Highest education level: High school graduate  Occupational History   Not on file  Tobacco Use   Smoking status: Never   Smokeless tobacco: Never  Vaping Use   Vaping status: Never Used  Substance and Sexual Activity   Alcohol use: Never   Drug use: Never   Sexual activity: Not on file  Other Topics Concern   Not on file  Social History Narrative   Lives with mother   R handed   Caffeine: 1 soda a day   Social Determinants of Health   Financial Resource Strain: Not on file  Food Insecurity: Not on file  Transportation Needs: Not on file  Physical Activity: Not on file  Stress: Not on file  Social Connections: Not on file  Intimate Partner Violence: Not on file     PHYSICAL EXAM  Vitals:   12/24/22 0909  BP: 127/84  Pulse: 86  Weight: (!) 344 lb 8 oz (156.3 kg)  Height: 5\' 5"  (1.651 m)     Body mass index is 57.33 kg/m.   General: The patient is well-developed and well-nourished and in no acute distress.   Pharynx is Mallmapati 2.    HEENT:  Head is [place]/AT.  Sclera are anicteric.   She has wax in her ear canals bilaterally and I  cannot see the tympanic membranes.  Neck: No carotid bruits are noted.  The neck is nontender.   Skin: Extremities are without rash or  edema.  Musculoskeletal:  Back is nontender  Neurologic Exam  Mental status: The patient is alert and oriented x 3 at the time of the examination. The patient has apparent normal recent and remote memory, with an apparently normal attention span and concentration ability.   Speech is normal.  Cranial nerves: Extraocular movements are full. Pupils ashow 1+ right APD and she has reduced color vision OD. No dysarthria.      Motor:  Muscle bulk is normal.   Tone is normal. Strength is  5 / 5 in left arm and hand and both legs but 4+/5 intrinsic hand muscles on the right.  Left rotating movements are mildly reduced in the right arm.  Sensory: Sensory testing is intact to pinprick, soft touch sensation in all 4 extremities.   She has reduced vibration sensation but preserved position sensation on the right.   Reduced sensation left thenar eminence  Coordination: Cerebellar testing reveals good finger-nose-finger and heel-to-shin bilaterally.  Gait and station: Station is normal.   Gait is slightly wide. Tandem gait is wide. Romberg is negative.   Reflexes: Deep tendon reflexes are symmetric and normal bilaterally.   Plantar responses are flexor.  Other: Dix-Hallpike maneuver did not to  nystagmus though did produce mild vertigo  EDSS 5.0      DIAGNOSTIC DATA (LABS, IMAGING, TESTING) - I reviewed patient records, labs, notes, testing and imaging myself where available.  Lab Results  Component Value Date   WBC 9.5 10/14/2021   HGB 11.5 (L) 10/14/2021   HCT 34.7 (L) 10/14/2021   MCV 86.8 10/14/2021   PLT 398 10/14/2021      Component Value Date/Time   NA 140 10/13/2021 0154   K 3.5 10/13/2021 0154   CL 106 10/13/2021 0154   CO2 29 10/13/2021 0154   GLUCOSE 141 (H) 10/13/2021  0154   BUN 14 10/13/2021 0154   CREATININE 0.78 10/13/2021 0154    CALCIUM 8.6 (L) 10/13/2021 0154   PROT 7.7 10/09/2021 1635   ALBUMIN 3.5 10/09/2021 1635   AST 15 10/09/2021 1635   ALT 13 10/09/2021 1635   ALKPHOS 99 10/09/2021 1635   BILITOT 0.5 10/09/2021 1635   GFRNONAA >60 10/13/2021 0154   Lab Results  Component Value Date   CHOL 171 10/11/2021   HDL 45 10/11/2021   LDLCALC 118 (H) 10/11/2021   TRIG 40 10/11/2021   CHOLHDL 3.8 10/11/2021   Lab Results  Component Value Date   HGBA1C 4.7 (L) 10/10/2021   No results found for: "VITAMINB12" Lab Results  Component Value Date   TSH 2.722 10/09/2021       ASSESSMENT AND PLAN  Multiple sclerosis (HCC) - Plan: IgG, IgA, IgM, Comprehensive metabolic panel, CBC with Differential/Platelet  High risk medication use - Plan: IgG, IgA, IgM, Comprehensive metabolic panel, CBC with Differential/Platelet  Gait disturbance  Numbness  Left carpal tunnel syndrome   1.  She will continue Kesimpta.   Check labs 2.  Wrist brace for left CTS. 3.  Stay active and exercise as tolerated. 4.  Left splenius capitus TPI  with 80 mg Depo-Medrol in [place] using sterile technique.  She tolerated the procedure wlel and there were no complications.   Pain was better afterwards.   5.   Lamotrigine for dysesthesias and mood  6.   Trial of dalfampridine.  Her EDSS is 5.0. Follow-up in 6 months or sooner if there are new or worsening neurologic symptoms.  Teighlor Korson A. Epimenio Foot, MD, [facility] 12/24/2022, 9:56 AM Certified in Neurology, Clinical Neurophysiology, Sleep Medicine and Neuroimaging  [facility] [address], [address] [place], [place] [address] [phone]

## 2022-12-25 ENCOUNTER — Encounter: Payer: Self-pay | Admitting: Neurology

## 2022-12-25 ENCOUNTER — Other Ambulatory Visit: Payer: Self-pay

## 2022-12-26 MED ORDER — SERTRALINE HCL 100 MG PO TABS: 100.0000 mg | ORAL_TABLET | Freq: Every day | ORAL | 5 refills | Status: DC

## 2023-01-12 DIAGNOSIS — R0683 Snoring: Secondary | ICD-10-CM | POA: Diagnosis not present

## 2023-01-12 DIAGNOSIS — Z79899 Other long term (current) drug therapy: Secondary | ICD-10-CM | POA: Diagnosis not present

## 2023-01-12 DIAGNOSIS — G35 Multiple sclerosis: Secondary | ICD-10-CM | POA: Diagnosis not present

## 2023-01-12 DIAGNOSIS — F419 Anxiety disorder, unspecified: Secondary | ICD-10-CM | POA: Diagnosis not present

## 2023-01-12 DIAGNOSIS — E559 Vitamin D deficiency, unspecified: Secondary | ICD-10-CM | POA: Diagnosis not present

## 2023-01-12 DIAGNOSIS — Z6841 Body Mass Index (BMI) 40.0 and over, adult: Secondary | ICD-10-CM | POA: Diagnosis not present

## 2023-01-12 LAB — LAB REPORT - SCANNED
A1c: 5.1
EGFR: 85

## 2023-01-28 ENCOUNTER — Encounter: Payer: Self-pay | Admitting: *Deleted

## 2023-01-28 ENCOUNTER — Telehealth: Payer: Self-pay | Admitting: *Deleted

## 2023-01-28 NOTE — Telephone Encounter (Signed)
-----   Message from Asa Lente sent at 01/27/2023  6:34 PM EDT ----- It looks like she never got her Ampyra (dalfampridine).  There is a form in the box with the information filled out that can be sent to her specialty pharmacy.

## 2023-01-28 NOTE — Telephone Encounter (Addendum)
Enrollment form competed to [facility] Dr. Vickey Huger  to sign as Dr Epimenio Foot out.  Signed and faxed to [phone]. Received.

## 2023-01-28 NOTE — Telephone Encounter (Signed)
Opened in error

## 2023-02-02 ENCOUNTER — Encounter: Payer: Self-pay | Admitting: Neurology

## 2023-02-03 MED ORDER — LAMOTRIGINE 100 MG PO TABS: ORAL_TABLET | ORAL | 5 refills | Status: DC

## 2023-02-04 ENCOUNTER — Other Ambulatory Visit (HOSPITAL_COMMUNITY): Payer: Self-pay

## 2023-02-04 ENCOUNTER — Telehealth: Payer: Self-pay | Admitting: *Deleted

## 2023-02-04 ENCOUNTER — Telehealth: Payer: Self-pay

## 2023-02-04 NOTE — Telephone Encounter (Signed)
Pt dalfampridine 10 mg TB12 needs PA  UJW:[id number]

## 2023-02-04 NOTE — Telephone Encounter (Signed)
PA request has been Submitted. New Encounter created for follow up. For additional info see Pharmacy Prior Auth telephone encounter from 02/04/2023.

## 2023-02-04 NOTE — Telephone Encounter (Signed)
Pharmacy Patient Advocate Encounter   Received notification from Physician's Office that prior authorization for Dalfampridine ER 10MG  er tablets is required/requested.   Insurance verification completed.   The patient is insured through [facility] .   Per test claim: PA required; PA started via CoverMyMeds. KEY [id number] . Waiting for clinical questions to populate.

## 2023-02-09 NOTE — Telephone Encounter (Signed)
  I could not find documentation in the chart-does provider want me to submit as is? Please advise-Thanks.

## 2023-02-10 NOTE — Telephone Encounter (Signed)
Pharmacy Patient Advocate Encounter   Received notification from Physician's Office that prior authorization for Dalfampridine ER 10MG  er tablets is required/requested.   Insurance verification completed.   The patient is insured through [facility] .   Per test claim: PA required; PA submitted to [employer] via CoverMyMeds Key/confirmation #/EOC [id number] Status is pending

## 2023-02-12 NOTE — Telephone Encounter (Signed)
Pharmacy Patient Advocate Encounter  Received notification from [facility] that Prior Authorization for Dalfampridine ER 10MG  er tablets has been DENIED.  Full denial letter will be uploaded to the media tab. See denial reason below.   PA #/Case ID/Reference #: PA Case ID #: [id number]

## 2023-02-15 NOTE — Telephone Encounter (Signed)
Dr. Epimenio Foot- no timed walk test documented and PA denied. How do you want to proceed?

## 2023-02-15 NOTE — Telephone Encounter (Signed)
Called pt. Scheduled work in visit for 03/01/23 at 3:00pm with Dr. Epimenio Foot to do 11ft timed walk test to get dalfampridine covered. Asked her to check in at 2:30pm. Added to wait list as well in case sooner appt becomes available.

## 2023-03-01 ENCOUNTER — Ambulatory Visit (INDEPENDENT_AMBULATORY_CARE_PROVIDER_SITE_OTHER): Payer: Medicaid Other | Admitting: Neurology

## 2023-03-01 ENCOUNTER — Encounter: Payer: Self-pay | Admitting: Neurology

## 2023-03-01 VITALS — BP 133/68 | HR 89 | Ht 65.0 in | Wt 343.0 lb

## 2023-03-01 DIAGNOSIS — G35 Multiple sclerosis: Secondary | ICD-10-CM | POA: Diagnosis not present

## 2023-03-01 DIAGNOSIS — G5602 Carpal tunnel syndrome, left upper limb: Secondary | ICD-10-CM | POA: Diagnosis not present

## 2023-03-01 DIAGNOSIS — R269 Unspecified abnormalities of gait and mobility: Secondary | ICD-10-CM

## 2023-03-01 DIAGNOSIS — M542 Cervicalgia: Secondary | ICD-10-CM

## 2023-03-01 DIAGNOSIS — Z79899 Other long term (current) drug therapy: Secondary | ICD-10-CM

## 2023-03-01 DIAGNOSIS — G35D Multiple sclerosis, unspecified: Secondary | ICD-10-CM

## 2023-03-01 NOTE — Progress Notes (Signed)
[facility]  PATIENT: Shelley Simon DOB: 27-Mar-2001  REFERRING DOCTOR OR PCP: Johny Sax, MD; Lucianne Lei, MD SOURCE: Patient, notes from recent hospitalization, imaging and lab reports, MRI images personally reviewed.  _________________________________   HISTORICAL  CHIEF COMPLAINT:  Chief Complaint  Patient presents with   Room 10    Pt is here with her mother. Pt states that things have been doing good since her last appointment.     HISTORY OF PRESENT ILLNESS:  Shelley Simon is a 22 y.o. woman with MS.  Update 03/01/2023 She is on  Kesimpta (started July 2023) and is tolerating it well.  She is doing well giving the shots.    Labs were fine 12/2022.  Gait is off balanced and wide.  She can walk about 250-500 feet without a rest, but cannot walk further.   She needs to use the bannister on stairs. She has had a few falls.    Her turns are wobbly.    We tried to start Ampyra but insurance would not authorize without 25FTW.  The right hand is still clumsy - typing and texting and holding items is more difficult.  She has to hod a glass with both hands (she is right handed).   This is frustrating.   The leg numbness has improved a lot and is now minimal.  She has left hand numbness wrist down, sometimes awakening her.    Vision is fine.  She notes mild cognitive issues - processing mostly.   She has noted some word finding issues.   Focus is reduced.   She has had more fatigue this year.    She is mildly depressed and is on sertraline 100 mg.   She is irritable.   She gets agitated at times and fidgets her leg a lot.     She sleeps well only some  night.   She snores but had no OSA on HST.   She continues to have daytime sleepiness.  She has had a left sided HA that is daily but minimal in the morning but worsens as day goes on, sometimes with migrainous features like photophobia.   Pain is worse with movements.   Take vit D 4000-5000 daily  MS  Hstory She presented to the [facility] emergency room 10/08/2021 with worsening arm strength and numbness, right greater than left, over the last couple weeks.  She noted reduced fine motor control and reported difficulty with typing.   The day after the arms were affected she noted leg numbness.   She needd to hold on when she shampooed her hair.  The right side weakened further and the hand had more loss of control prompting an Urgent Care visit and referral that day to the ED.    In the ED, she had MRI of the brain showing about 5 T2/FLAIR hyperintense foci in the cerebral hemispheres in the periventricular and juxtacortical white matter.  MRI of the cervical and thoracic spine was also performed.  It showed a T2 hyperintense focus centrally more to the right from C3 to C4-C5 and another subtle focus at C5-C6 posterolaterally to the right..  There were no definite foci within the thoracic spine though the study was limited by motion artifact.  She was admitted and received 5 days of IV Solu-Medrol.   She noted improvement  In retrospect, she has had other episodes of hand numbness lasting a day or two.   She also had a couple episodes lasting  a few weeks of gait ataxia.  She also had a couple episodes where vision was cloudy for a few days.  She had one episode of diplopia lasting a few days.         Imaging studies reviewed: MRI of the brain 10/09/2021 shows 5 T2/FLAIR hyperintense foci in the cerebral hemispheres, some in the periventricular white matter.  1 is in the juxtacortical white matter.  2 foci also noted in the right cerebellar hemisphere.  None of the foci enhance.  MRI of the cervical and thoracic spine 10/09/2021 showed a T2 hyperintense focus from C3 to C4-C5 towards the right and another focus posterolaterally to the right C5-C6.  There is subtle enhancement adjacent to C4 (sagittal postcontrast image 9/16).  Degenerative disc changes are noted at C4-C5, and T8-T9.  Laboratory  studies: May 2023: HIV is negative.  Hemoglobin A1c is negative.  CBC and CMP were noncontributory.  Anti-NMO IgG and anti-MOG antibody were both negative.  REVIEW OF SYSTEMS:  REVIEW OF SYSTEMS: Constitutional: No fevers, chills, sweats, or change in appetite Eyes: No visual changes, double vision, eye pain Ear, nose and throat: No hearing loss, ear pain, nasal congestion, sore throat Cardiovascular: No chest pain, palpitations Respiratory:  No shortness of breath at rest or with exertion.   No wheezes GastrointestinaI: No nausea, vomiting, diarrhea, abdominal pain, fecal incontinence Genitourinary:  No dysuria, urinary retention or frequency.  No nocturia. Musculoskeletal:  No neck pain, back pain Integumentary: No rash, pruritus, skin lesions Neurological: as above Psychiatric: No depression at this time.  No anxiety Endocrine: No palpitations, diaphoresis, change in appetite, change in weigh or increased thirst Hematologic/Lymphatic:  No anemia, purpura, petechiae. Allergic/Immunologic: No itchy/runny eyes, nasal congestion, recent allergic reactions, rashes  ALLERGIES: Allergies  Allergen Reactions   Amoxicillin Hives   Augmentin [Amoxicillin-Pot Clavulanate] Hives   Penicillins Hives   Zithromax [Azithromycin] Hives    HOME MEDICATIONS:  Current Outpatient Medications:    diphenhydrAMINE (BENADRYL) 25 MG tablet, Take 25 mg by mouth at bedtime as needed for allergies or sleep., Disp: , Rfl:    hydrOXYzine (ATARAX) 25 MG tablet, Take 1 tablet (25 mg total) by mouth 3 (three) times daily as needed for anxiety., Disp: 90 tablet, Rfl: 5   ibuprofen (ADVIL) 200 MG tablet, Take 800 mg by mouth every 6 (six) hours as needed for headache or moderate pain., Disp: , Rfl:    KESIMPTA 20 MG/0.4ML SOAJ, INJECT 1 PEN (20MG ) SUBCUTANEOUSLY MONTHLY STARTING AT WEEK 4 AS DIRECTED, Disp: 0.4 mL, Rfl: 7   lamoTRIgine (LAMICTAL) 100 MG tablet, Take 2 tablets by mouth twice daily, Disp: 120  tablet, Rfl: 5   sertraline (ZOLOFT) 100 MG tablet, Take 1 tablet (100 mg total) by mouth daily., Disp: 30 tablet, Rfl: 5   Vitamin D, Ergocalciferol, (DRISDOL) 1.25 MG (50000 UNIT) CAPS capsule, Take 50,000 Units by mouth every Monday., Disp: , Rfl:    dalfampridine 10 MG TB12, One po q12 hours (Patient not taking: Reported on 12/24/2022), Disp: 60 tablet, Rfl: 5  PAST MEDICAL HISTORY: Past Medical History:  Diagnosis Date   Asthma    Multiple sclerosis (HCC)     PAST SURGICAL HISTORY: Past Surgical History:  Procedure Laterality Date   TONSILLECTOMY  2005    FAMILY HISTORY: Family History  Problem Relation Age of Onset   Thyroid disease Mother    Migraines Mother    Asthma Brother     SOCIAL HISTORY:  Social History   Socioeconomic History  Marital status: Single    Spouse name: Not on file   Number of children: 0   Years of education: Not on file   Highest education level: High school graduate  Occupational History   Not on file  Tobacco Use   Smoking status: Never   Smokeless tobacco: Never  Vaping Use   Vaping status: Never Used  Substance and Sexual Activity   Alcohol use: Never   Drug use: Never   Sexual activity: Not on file  Other Topics Concern   Not on file  Social History Narrative   Lives with mother   R handed   Caffeine: 1 soda a day   Social Determinants of Health   Financial Resource Strain: Not on file  Food Insecurity: Not on file  Transportation Needs: Not on file  Physical Activity: Not on file  Stress: Not on file  Social Connections: Not on file  Intimate Partner Violence: Not on file     PHYSICAL EXAM  Vitals:   03/01/23 1459  BP: 133/68  Pulse: 89  Weight: (!) 343 lb (155.6 kg)  Height: 5\' 5"  (1.651 m)     Body mass index is 57.08 kg/m.   General: The patient is well-developed and well-nourished and in no acute distress.   Pharynx is Mallmapati 2.    HEENT:  Head is Doyle/AT.  Sclera are anicteric.       Skin:  Extremities are without rash or  edema.  Musculoskeletal:  Back is nontender  Neurologic Exam  Mental status: The patient is alert and oriented x 3 at the time of the examination. The patient has apparent normal recent and remote memory, with an apparently normal attention span and concentration ability.   Speech is normal.  Cranial nerves: Extraocular movements are full. Pupils ashow 1+ right APD and she has reduced color vision OD. No dysarthria.      Motor:  Muscle bulk is normal.   Tone is normal. Strength is  5 / 5 in left arm and hand and both legs but 4+/5 intrinsic hand muscles on the right.  Left rotating movements are mildly reduced in the right arm.  Sensory: Sensory testing is intact to pinprick, soft touch sensation in all 4 extremities.   She has reduced vibration sensation but preserved position sensation on the right.   Reduced sensation left thenar eminence  Coordination: Cerebellar testing reveals good finger-nose-finger and heel-to-shin bilaterally.  Gait and station: Station is normal.   Gait is slightly wide. Tandem gait is wide. Romberg is negative.   Reflexes: Deep tendon reflexes are symmetric and normal bilaterally.   Plantar responses are flexor.  25 foot timed walk (average of 2 trials) is 10.6 seconds  EDSS 5.5 (ambulatory 100 M without assistance)    DIAGNOSTIC DATA (LABS, IMAGING, TESTING) - I reviewed patient records, labs, notes, testing and imaging myself where available.  Lab Results  Component Value Date   WBC 8.4 12/24/2022   HGB 13.1 12/24/2022   HCT 40.0 12/24/2022   MCV 80 12/24/2022   PLT 544 (H) 12/24/2022      Component Value Date/Time   NA 137 12/24/2022 1046   K 4.7 12/24/2022 1046   CL 101 12/24/2022 1046   CO2 22 12/24/2022 1046   GLUCOSE 89 12/24/2022 1046   GLUCOSE 141 (H) 10/13/2021 0154   BUN 10 12/24/2022 1046   CREATININE 0.89 12/24/2022 1046   CALCIUM 9.2 12/24/2022 1046   PROT 7.3 12/24/2022 1046   ALBUMIN  4.0  12/24/2022 1046   AST 14 12/24/2022 1046   ALT 9 12/24/2022 1046   ALKPHOS 140 (H) 12/24/2022 1046   BILITOT 0.3 12/24/2022 1046   GFRNONAA >60 10/13/2021 0154   Lab Results  Component Value Date   CHOL 171 10/11/2021   HDL 45 10/11/2021   LDLCALC 118 (H) 10/11/2021   TRIG 40 10/11/2021   CHOLHDL 3.8 10/11/2021   Lab Results  Component Value Date   HGBA1C 4.7 (L) 10/10/2021   No results found for: "VITAMINB12" Lab Results  Component Value Date   TSH 2.722 10/09/2021       ASSESSMENT AND PLAN  Multiple sclerosis (HCC)  High risk medication use  Gait disturbance  Left carpal tunnel syndrome  Neck pain   1.  She will continue Kesimpta.   Check labs 2.  Ampyra for gait.  GFR=93 normal.    Timed 25Foot Walk is 10.6 sec and EDSS is 5.5 3.  Stay active and exercise as tolerated. 4.    Lamotrigine for dysesthesias and mood  6.    follow-up in 6 months or sooner if there are new or worsening neurologic symptoms.  Bhavya Eschete A. Epimenio Foot, MD, [facility] 03/01/2023, 3:37 PM Certified in Neurology, Clinical Neurophysiology, Sleep Medicine and Neuroimaging  [facility] [address], [address] [place], [place] [address] [phone]

## 2023-03-01 NOTE — Telephone Encounter (Signed)
I saw her back in the office today.  I have documented the 25 foot timed walk as well as the EDSS score.  The 25 foot timed walk was 10.6 seconds.  EDSS was 5.5.

## 2023-03-02 ENCOUNTER — Other Ambulatory Visit (HOSPITAL_COMMUNITY): Payer: Self-pay

## 2023-03-02 NOTE — Telephone Encounter (Signed)
Pharmacy Patient Advocate Encounter   Received notification from Physician's Office that prior authorization for Dalfampridine ER 10MG  er tablets is required/requested.   Insurance verification completed.   The patient is insured through [facility] .   Per test claim: PA required; [place] [employer] via CoverMyMeds Key/confirmation #/EOC [id number] Status is pending  Thank you for getting that information-have submitted new PA-awaiting determination.

## 2023-03-09 ENCOUNTER — Telehealth: Payer: Self-pay | Admitting: *Deleted

## 2023-03-17 ENCOUNTER — Encounter: Payer: Self-pay | Admitting: *Deleted

## 2023-03-18 ENCOUNTER — Other Ambulatory Visit (HOSPITAL_COMMUNITY): Payer: Self-pay

## 2023-03-18 ENCOUNTER — Telehealth: Payer: Self-pay

## 2023-03-18 NOTE — Telephone Encounter (Addendum)
Pharmacy Patient Advocate Encounter   Received notification from Physician's Office that prior authorization for Ampyra 10MG  er tablets is required/requested.   Insurance verification completed.   The patient is insured through [facility] .   Per test claim: PA required; PA started via CoverMyMeds. KEY [id number] . Waiting for clinical questions to populate.

## 2023-03-24 ENCOUNTER — Other Ambulatory Visit (HOSPITAL_COMMUNITY): Payer: Self-pay

## 2023-03-24 NOTE — Telephone Encounter (Signed)
Called and spoke with mother. Relayed info. They were aware. She actually changed insurance from [place] to her own [employer] not aware. She will call [employer] at [phone] to make them aware and call back if we need to do anything further.

## 2023-03-24 NOTE — Telephone Encounter (Signed)
This should be for generic not brand. Cancel PA plus PT has new Medicaid insurance.

## 2023-03-24 NOTE — Telephone Encounter (Signed)
PT has new [employer] and per CMM no PA needed.

## 2023-04-14 ENCOUNTER — Telehealth: Payer: Self-pay

## 2023-04-14 NOTE — Telephone Encounter (Signed)
LVM giving pt [employer] number to call them with her income information [phone]

## 2023-07-01 ENCOUNTER — Ambulatory Visit: Payer: Medicaid Other | Admitting: Neurology

## 2023-07-01 ENCOUNTER — Encounter: Payer: Self-pay | Admitting: Neurology

## 2023-07-01 VITALS — BP 127/84 | HR 106 | Ht 65.0 in | Wt 341.5 lb

## 2023-07-01 DIAGNOSIS — G35D Multiple sclerosis, unspecified: Secondary | ICD-10-CM

## 2023-07-01 DIAGNOSIS — M542 Cervicalgia: Secondary | ICD-10-CM

## 2023-07-01 DIAGNOSIS — R269 Unspecified abnormalities of gait and mobility: Secondary | ICD-10-CM | POA: Diagnosis not present

## 2023-07-01 DIAGNOSIS — G35 Multiple sclerosis: Secondary | ICD-10-CM

## 2023-07-01 DIAGNOSIS — Z79899 Other long term (current) drug therapy: Secondary | ICD-10-CM | POA: Diagnosis not present

## 2023-07-01 DIAGNOSIS — F32A Depression, unspecified: Secondary | ICD-10-CM

## 2023-07-01 DIAGNOSIS — R519 Headache, unspecified: Secondary | ICD-10-CM

## 2023-07-01 MED ORDER — PHENTERMINE HCL 37.5 MG PO CAPS: 37.5000 mg | ORAL_CAPSULE | ORAL | 5 refills | Status: DC

## 2023-07-01 MED ORDER — IMIPRAMINE HCL 25 MG PO TABS: 25.0000 mg | ORAL_TABLET | Freq: Every day | ORAL | 11 refills | Status: DC

## 2023-07-01 NOTE — Progress Notes (Signed)
[facility]  PATIENT: Shelley Simon DOB: [birth year]  REFERRING DOCTOR OR PCP: Johny Sax, MD; Lucianne Lei, MD SOURCE: Patient, notes from recent hospitalization, imaging and lab reports, MRI images personally reviewed.  _________________________________   HISTORICAL  CHIEF COMPLAINT:  Chief Complaint  Patient presents with   Room 1    Pt is here with her Mother. Pt states that she has been having more headaches. Pt states that her left hand will have a numbing, pins and needles feeling in it. Pt states that she will fill a sharp pain in her hand. Pt's mother states that pt fell 2 weeks ago.     HISTORY OF PRESENT ILLNESS:  Shelley Simon is a 23 y.o. woman with MS.  Update 06/30/2013 She is on  Kesimpta (started July 2023) and is tolerating it well.  She is doing well and is able to do the shots without complications..    Labs were fine 12/2022.  She is noticing more HA on the left near the temple and eye. These are daily and are mild in the morning and more severe in the afternoons and then has difficulty with focus.    She does take classes online and HA affects ability to focus.   She has mild nausea and has photophobia.   When pain is more intense she also has neck pain.   Ibuprofen helps for a few hours  Gait is poor.  It off balanced and wide.  She can walk about 250-500 feet without a rest, but cannot walk further.   Right leg is weaker than left.  She needs to use the bannister on stairs. She has had a few falls and stumbles often    We tried Ampyra but there was no benefit.   The right hand is still clumsy - typing and texting and holding items is more difficult. Left hand is stronger but hurts more and she sometimes wears a wrist splint.   The leg numbness has improved a lot and is now minimal.  She has left hand numbness wrist down, sometimes awakening her.      Vision is fine.   Bladder function is fine.     She notes mild cognitive issues -  processing mostly.   She has noted some word finding issues.   Focus is reduced.   She has had more fatigue this year.      She is mildly depressed and is on sertraline 100 mg.  Lamotrigine has helped.  She has occasional  irritability   She gets agitated at times and fidgets her leg a lot.   She feels anxious at time.     She has some sleep maintenanc insomnia and noted She snores but had no OSA on HST.   She continues to have daytime sleepiness.  Take vit D 4000-5000 daily  MS Hstory She presented to the [facility] emergency room 10/08/2021 with worsening arm strength and numbness, right greater than left, over the last couple weeks.  She noted reduced fine motor control and reported difficulty with typing.   The day after the arms were affected she noted leg numbness.   She needd to hold on when she shampooed her hair.  The right side weakened further and the hand had more loss of control prompting an Urgent Care visit and referral that day to the ED.    In the ED, she had MRI of the brain showing about 5 T2/FLAIR hyperintense foci in the cerebral  hemispheres in the periventricular and juxtacortical white matter.  MRI of the cervical and thoracic spine was also performed.  It showed a T2 hyperintense focus centrally more to the right from C3 to C4-C5 and another subtle focus at C5-C6 posterolaterally to the right..  There were no definite foci within the thoracic spine though the study was limited by motion artifact.  She was admitted and received 5 days of IV Solu-Medrol.   She noted improvement  In retrospect, she has had other episodes of hand numbness lasting a day or two.   She also had a couple episodes lasting a few weeks of gait ataxia.  She also had a couple episodes where vision was cloudy for a few days.  She had one episode of diplopia lasting a few days.         Imaging studies reviewed: MRI of the brain 10/09/2021 shows 5 T2/FLAIR hyperintense foci in the cerebral hemispheres, some in  the periventricular white matter.  1 is in the juxtacortical white matter.  2 foci also noted in the right cerebellar hemisphere.  None of the foci enhance.  MRI of the cervical and thoracic spine 10/09/2021 showed a T2 hyperintense focus from C3 to C4-C5 towards the right and another focus posterolaterally to the right C5-C6.  There is subtle enhancement adjacent to C4 (sagittal postcontrast image 9/16).  Degenerative disc changes are noted at C4-C5, and T8-T9.  Laboratory studies: May 2023: HIV is negative.  Hemoglobin A1c is negative.  CBC and CMP were noncontributory.  Anti-NMO IgG and anti-MOG antibody were both negative.  REVIEW OF SYSTEMS:  REVIEW OF SYSTEMS: Constitutional: No fevers, chills, sweats, or change in appetite Eyes: No visual changes, double vision, eye pain Ear, nose and throat: No hearing loss, ear pain, nasal congestion, sore throat Cardiovascular: No chest pain, palpitations Respiratory:  No shortness of breath at rest or with exertion.   No wheezes GastrointestinaI: No nausea, vomiting, diarrhea, abdominal pain, fecal incontinence Genitourinary:  No dysuria, urinary retention or frequency.  No nocturia. Musculoskeletal:  No neck pain, back pain Integumentary: No rash, pruritus, skin lesions Neurological: as above Psychiatric: No depression at this time.  No anxiety Endocrine: No palpitations, diaphoresis, change in appetite, change in weigh or increased thirst Hematologic/Lymphatic:  No anemia, purpura, petechiae. Allergic/Immunologic: No itchy/runny eyes, nasal congestion, recent allergic reactions, rashes  ALLERGIES: Allergies  Allergen Reactions   Amoxicillin Hives   Augmentin [Amoxicillin-Pot Clavulanate] Hives   Penicillins Hives   Zithromax [Azithromycin] Hives    HOME MEDICATIONS:  Current Outpatient Medications:    diphenhydrAMINE (BENADRYL) 25 MG tablet, Take 25 mg by mouth at bedtime as needed for allergies or sleep., Disp: , Rfl:    hydrOXYzine  (ATARAX) 25 MG tablet, Take 1 tablet (25 mg total) by mouth 3 (three) times daily as needed for anxiety., Disp: 90 tablet, Rfl: 5   ibuprofen (ADVIL) 200 MG tablet, Take 800 mg by mouth every 6 (six) hours as needed for headache or moderate pain., Disp: , Rfl:    imipramine (TOFRANIL) 25 MG tablet, Take 1 tablet (25 mg total) by mouth at bedtime., Disp: 30 tablet, Rfl: 11   KESIMPTA 20 MG/0.4ML SOAJ, INJECT 1 PEN (20MG ) SUBCUTANEOUSLY MONTHLY STARTING AT WEEK 4 AS DIRECTED, Disp: 0.4 mL, Rfl: 7   lamoTRIgine (LAMICTAL) 100 MG tablet, Take 2 tablets by mouth twice daily, Disp: 120 tablet, Rfl: 5   sertraline (ZOLOFT) 100 MG tablet, Take 1 tablet (100 mg total) by mouth daily., Disp: 30  tablet, Rfl: 5   Vitamin D, Ergocalciferol, (DRISDOL) 1.25 MG (50000 UNIT) CAPS capsule, Take 50,000 Units by mouth every Monday., Disp: , Rfl:    dalfampridine 10 MG TB12, One po q12 hours (Patient not taking: Reported on 07/01/2023), Disp: 60 tablet, Rfl: 5  PAST MEDICAL HISTORY: Past Medical History:  Diagnosis Date   Asthma    Multiple sclerosis (HCC)     PAST SURGICAL HISTORY: Past Surgical History:  Procedure Laterality Date   TONSILLECTOMY  2005    FAMILY HISTORY: Family History  Problem Relation Age of Onset   Thyroid disease Mother    Migraines Mother    Asthma Brother     SOCIAL HISTORY:  Social History   Socioeconomic History   Marital status: Single    Spouse name: Not on file   Number of children: 0   Years of education: Not on file   Highest education level: High school graduate  Occupational History   Not on file  Tobacco Use   Smoking status: Never   Smokeless tobacco: Never  Vaping Use   Vaping status: Never Used  Substance and Sexual Activity   Alcohol use: Never   Drug use: Never   Sexual activity: Not on file  Other Topics Concern   Not on file  Social History Narrative   Lives with mother   R handed   Caffeine: 1 soda a day   Social Drivers of Manufacturing engineer Strain: Not on file  Food Insecurity: Not on file  Transportation Needs: Not on file  Physical Activity: Not on file  Stress: Not on file  Social Connections: Not on file  Intimate Partner Violence: Not on file     PHYSICAL EXAM  Vitals:   07/01/23 1134  BP: 127/84  Pulse: (!) 106  Weight: (!) 341 lb 8 oz (154.9 kg)  Height: 5\' 5"  (1.651 m)     Body mass index is 56.83 kg/m.   General: The patient is well-developed and well-nourished and in no acute distress.   Pharynx is Mallmapati 2.    HEENT:  Head is Superior/AT.  Sclera are anicteric.       Skin: Extremities are without rash or  edema.  Musculoskeletal:  Back is nontender  Neurologic Exam  Mental status: The patient is alert and oriented x 3 at the time of the examination. The patient has apparent normal recent and remote memory, with an apparently normal attention span and concentration ability.   Speech is normal.  Cranial nerves: Extraocular movements are full. Pupils ashow 1+ right APD and she has reduced color vision OD. No dysarthria.      Motor:  Muscle bulk is normal.   Tone is normal. Strength is  5 / 5 in left arm and hand and both legs but 4+/5 intrinsic hand muscles on the right.  Left rotating movements are mildly reduced in the right arm.  Sensory: Sensory testing is intact to pinprick, soft touch sensation in all 4 extremities.   She has reduced vibration sensation but preserved position sensation on the right.   Reduced sensation left thenar eminence  Coordination: Cerebellar testing reveals good finger-nose-finger and heel-to-shin bilaterally.  Gait and station: Station is normal.   Gait is slightly wide. Tandem gait is wide. Romberg is negative.   Reflexes: Deep tendon reflexes are symmetric and normal bilaterally.   Plantar responses are flexor.  25 foot timed walk (average of 2 trials) is 10.6 seconds  EDSS 5.5 (  ambulatory 100 M without assistance)    DIAGNOSTIC DATA  (LABS, IMAGING, TESTING) - I reviewed patient records, labs, notes, testing and imaging myself where available.  Lab Results  Component Value Date   WBC 8.4 12/24/2022   HGB 13.1 12/24/2022   HCT 40.0 12/24/2022   MCV 80 12/24/2022   PLT 544 (H) 12/24/2022      Component Value Date/Time   NA 137 12/24/2022 1046   K 4.7 12/24/2022 1046   CL 101 12/24/2022 1046   CO2 22 12/24/2022 1046   GLUCOSE 89 12/24/2022 1046   GLUCOSE 141 (H) 10/13/2021 0154   BUN 10 12/24/2022 1046   CREATININE 0.89 12/24/2022 1046   CALCIUM 9.2 12/24/2022 1046   PROT 7.3 12/24/2022 1046   ALBUMIN 4.0 12/24/2022 1046   AST 14 12/24/2022 1046   ALT 9 12/24/2022 1046   ALKPHOS 140 (H) 12/24/2022 1046   BILITOT 0.3 12/24/2022 1046   GFRNONAA >60 10/13/2021 0154   Lab Results  Component Value Date   CHOL 171 10/11/2021   HDL 45 10/11/2021   LDLCALC 118 (H) 10/11/2021   TRIG 40 10/11/2021   CHOLHDL 3.8 10/11/2021   Lab Results  Component Value Date   HGBA1C 4.7 (L) 10/10/2021   No results found for: "VITAMINB12" Lab Results  Component Value Date   TSH 2.722 10/09/2021       ASSESSMENT AND PLAN  Multiple sclerosis (HCC)  High risk medication use  Gait disturbance  Neck pain  Occipital headache   1.  She will continue Kesimpta for MS.   Check labs 2.  Left occipital nerve block with 80 mg Depo-merol in Marcaine usiang sterile technique.  She tolerated the procedure well and there were no complication 3.  Stay active and exercise as tolerated. 4.   Continue Lamotrigine sertraline for dysesthesias and mood and imipramine for headache.  This will also help insomnia and nocturia 6.    Phentermine for weight and ADD symptoms, if not better, consider another stimulant follow-up in 6 months or sooner if there are new or worsening neurologic symptoms.  Aiman Sonn A. Epimenio Foot, MD, [facility] 07/01/2023, 12:00 PM Certified in Neurology, Clinical Neurophysiology, Sleep Medicine and  Neuroimaging  [facility] [address], [address] [place], [place] [address] [phone]

## 2023-07-03 ENCOUNTER — Encounter: Payer: Self-pay | Admitting: Neurology

## 2023-07-03 LAB — IGG, IGA, IGM
IgA/Immunoglobulin A, Serum: 291 mg/dL (ref 87–352)
IgG (Immunoglobin G), Serum: 1283 mg/dL (ref 586–1602)
IgM (Immunoglobulin M), Srm: 66 mg/dL (ref 26–217)

## 2023-07-03 LAB — CD20 B CELLS
% CD19-B Cells: 0 % — ABNORMAL LOW (ref 4.6–22.1)
% CD20-B Cells: 0 % — ABNORMAL LOW (ref 5.0–22.3)

## 2023-09-06 ENCOUNTER — Other Ambulatory Visit: Payer: Self-pay | Admitting: *Deleted

## 2023-09-06 MED ORDER — SERTRALINE HCL 100 MG PO TABS: 100.0000 mg | ORAL_TABLET | Freq: Every day | ORAL | 1 refills | Status: AC

## 2023-09-06 NOTE — Telephone Encounter (Signed)
 Last seen on 07/01/23 Follow up scheduled on 01/24/24

## 2023-09-07 ENCOUNTER — Telehealth: Payer: Self-pay | Admitting: Neurology

## 2023-09-07 MED ORDER — LAMOTRIGINE 100 MG PO TABS: ORAL_TABLET | ORAL | 4 refills | Status: DC

## 2023-09-07 NOTE — Telephone Encounter (Signed)
 Pt's mother, Jasmine Mcbeth said, lamoTRIgine  (LAMICTAL ) 100 MG tablet only filled for 60 tablets, but should be filled for 120 tablets. Patient only has enough medication for one for day. Would like a call back when refill has been sent to  [facility] [phone]

## 2023-09-07 NOTE — Telephone Encounter (Signed)
 I called [facility] and spoke with pharmacy tech who spoke with pharmacist and was told they refilled based on old Rx in 12/2022. Pt was to increase dose and new Rx was sent into pharmacy in Sept 2024.  I called patient mother Shelley Simon ( has DPR access) and explained what [facility] told me, new Rx sent.  I asked mother to check the bottle this time before leaving the pharmacy. Shelley Simon ( mother) replied she would.

## 2023-12-21 ENCOUNTER — Other Ambulatory Visit: Payer: Self-pay | Admitting: Neurology

## 2023-12-22 ENCOUNTER — Other Ambulatory Visit: Payer: Self-pay

## 2023-12-22 MED ORDER — PHENTERMINE HCL 37.5 MG PO CAPS: 37.5000 mg | ORAL_CAPSULE | Freq: Every morning | ORAL | 0 refills | Status: DC

## 2023-12-22 NOTE — Telephone Encounter (Signed)
 Pt Last Seen 07/01/2023 Upcoming Appointment 01/24/2024  Phentermine  37.5 mg Last Filled 11/25/2023 (30 day supply)

## 2023-12-23 ENCOUNTER — Other Ambulatory Visit: Payer: Self-pay | Admitting: Neurology

## 2024-01-06 ENCOUNTER — Other Ambulatory Visit: Payer: Self-pay | Admitting: *Deleted

## 2024-01-06 DIAGNOSIS — G35 Multiple sclerosis: Secondary | ICD-10-CM

## 2024-01-06 MED ORDER — KESIMPTA 20 MG/0.4ML ~~LOC~~ SOAJ: 0.4000 mL | SUBCUTANEOUS | 1 refills | Status: DC

## 2024-01-17 ENCOUNTER — Other Ambulatory Visit: Payer: Self-pay | Admitting: Neurology

## 2024-01-18 ENCOUNTER — Other Ambulatory Visit: Payer: Self-pay

## 2024-01-18 NOTE — Telephone Encounter (Signed)
 Last seen on 07/01/23 Follow up scheduled on 01/24/24

## 2024-01-24 ENCOUNTER — Ambulatory Visit: Payer: Medicaid Other | Admitting: Neurology

## 2024-01-24 ENCOUNTER — Encounter: Payer: Self-pay | Admitting: Neurology

## 2024-01-24 VITALS — BP 114/70 | HR 103 | Ht 65.0 in | Wt 316.5 lb

## 2024-01-24 DIAGNOSIS — Z79899 Other long term (current) drug therapy: Secondary | ICD-10-CM

## 2024-01-24 DIAGNOSIS — G44219 Episodic tension-type headache, not intractable: Secondary | ICD-10-CM

## 2024-01-24 DIAGNOSIS — G35 Multiple sclerosis: Secondary | ICD-10-CM | POA: Diagnosis not present

## 2024-01-24 DIAGNOSIS — M542 Cervicalgia: Secondary | ICD-10-CM

## 2024-01-24 DIAGNOSIS — R200 Anesthesia of skin: Secondary | ICD-10-CM

## 2024-01-24 DIAGNOSIS — R269 Unspecified abnormalities of gait and mobility: Secondary | ICD-10-CM

## 2024-01-24 MED ORDER — IMIPRAMINE HCL 25 MG PO TABS: ORAL_TABLET | ORAL | 11 refills | Status: AC

## 2024-01-24 MED ORDER — BETAMETHASONE SOD PHOS & ACET 6 (3-3) MG/ML IJ SUSP
30.0000 mg | Freq: Once | INTRAMUSCULAR | Status: AC
  Administered 2024-01-24: 12 mg via INTRAMUSCULAR

## 2024-01-24 NOTE — Progress Notes (Signed)
 [facility]  PATIENT: Shelley Simon DOB: Nov 15, 2000  REFERRING DOCTOR OR PCP: Reyes Fenton, MD; Brad Badder, MD SOURCE: Patient, notes from recent hospitalization, imaging and lab reports, MRI images personally reviewed.  _________________________________   HISTORICAL  CHIEF COMPLAINT:  Chief Complaint  Patient presents with   RM11/MS    Pt is here with her Mother. Pt states that her left arm has gotten to the point she can't pick up things. Pt states that she has a prickly feeling all over her head.     HISTORY OF PRESENT ILLNESS:  Shelley Simon is a 23 y.o. woman with MS.  Update 01/24/2024 She is on  Kesimpta  (started July 2023) and is tolerating it well.  She is doing well and is able to do the shots without complications..    Labs were fine 07/01/23 --- CD19/20 were 0% and IgG/IgM were normal.    She notes more difficulty with the left hand. She notes weakness.   Numbness is mostly on the  back of the hand.   When she lays on her back, the whole left arm hurts and goes numb.    She has a headaches with stabbing pain near the left temple and eye. These are daily and are mild in the morning and more severe in the afternoons and then has difficulty with focus.  She has mild nausea and has photophobia.   When pain is more intense she also has neck pain.   Ibuprofen helps for a few hours.   She is on the computer much of the day.   No N/V.    No photophobia/phonophobia.  Occipital nerve / splenius capitus steroid TPIs help a couple months.  Gait is better than last visit.  off balanced and wide.  She feels she could walk a mile without a rest.  She doe snot keep up with others.    Right leg is weaker than left.  She needs to use the bannister on stairs. She has had a few falls and stumbles often    We tried Ampyra  but there was no benefit.   The left  hand is clumsy - typing and texting and holding items is more difficult. Left hand sometimes hurts and jerks at  times.   The leg numbness has improved a lot and is now minimal.  She has left hand numbness wrist down, sometimes awakening her.      Vision is fine.   Bladder function is fine.     She notes mild cognitive issues - processing mostly.   She has noted some word finding issues.   Focus is reduced.   She has had more fatigue this year.    She moves her legs a lot while sitting.   She is mildly depressed and is on sertraline  100 mg.  Lamotrigine  has helped.  She has occasional  irritability  She has anxiety still.        She has some sleep maintenanc insomnia and noted.   She snores but had no OSA on HST.   She continues to have daytime sleepiness.  Take vit D 4000-5000 daily  MS Hstory She presented to the [facility] emergency room 10/08/2021 with worsening arm strength and numbness, right greater than left, over the last couple weeks.  She noted reduced fine motor control and reported difficulty with typing.   The day after the arms were affected she noted leg numbness.   She needd to hold on when she  shampooed her hair.  The right side weakened further and the hand had more loss of control prompting an Urgent Care visit and referral that day to the ED.    In the ED, she had MRI of the brain showing about 5 T2/FLAIR hyperintense foci in the cerebral hemispheres in the periventricular and juxtacortical white matter.  MRI of the cervical and thoracic spine was also performed.  It showed a T2 hyperintense focus centrally more to the right from C3 to C4-C5 and another subtle focus at C5-C6 posterolaterally to the right..  There were no definite foci within the thoracic spine though the study was limited by motion artifact.  She was admitted and received 5 days of IV Solu-Medrol .   She noted improvement  In retrospect, she has had other episodes of hand numbness lasting a day or two.   She also had a couple episodes lasting a few weeks of gait ataxia.  She also had a couple episodes where vision was cloudy  for a few days.  She had one episode of diplopia lasting a few days.         Imaging studies reviewed: MRI of the brain 10/09/2021 shows 5 T2/FLAIR hyperintense foci in the cerebral hemispheres, some in the periventricular white matter.  1 is in the juxtacortical white matter.  2 foci also noted in the right cerebellar hemisphere.  None of the foci enhance.  MRI of the cervical and thoracic spine 10/09/2021 showed a T2 hyperintense focus from C3 to C4-C5 towards the right and another focus posterolaterally to the right C5-C6.  There is subtle enhancement adjacent to C4 (sagittal postcontrast image 9/16).  Degenerative disc changes are noted at C4-C5, and T8-T9.  MRI brain 06/19/2022 showed T2/FLAIR hyperintense foci in the cerebral hemispheres and cerebellum in a pattern consistent with chronic demyelinating plaque associated with multiple sclerosis. None of the foci appear to be acute. Compared to the MRI from 10/09/2021, there were no new lesions.   MRI cervical 06/19/22 There is patchy enhancing T2 hyperintense signal within the spinal cord centrally but more to the right from the inferior aspect of C2 to the inferior aspect of C4. There is possibly a small focus at the superior aspect of C6 seen on sagittal images but not on axial views. Changes are similar to the abnormality noted on the spinal cord 10/09/2021. .   Laboratory studies: May 2023: HIV is negative.  Hemoglobin A1c is negative.  CBC and CMP were noncontributory.  Anti-NMO IgG and anti-MOG antibody were both negative.  REVIEW OF SYSTEMS:  REVIEW OF SYSTEMS: Constitutional: No fevers, chills, sweats, or change in appetite Eyes: No visual changes, double vision, eye pain Ear, nose and throat: No hearing loss, ear pain, nasal congestion, sore throat Cardiovascular: No chest pain, palpitations Respiratory:  No shortness of breath at rest or with exertion.   No wheezes GastrointestinaI: No nausea, vomiting, diarrhea, abdominal pain, fecal  incontinence Genitourinary:  No dysuria, urinary retention or frequency.  No nocturia. Musculoskeletal:  No neck pain, back pain Integumentary: No rash, pruritus, skin lesions Neurological: as above Psychiatric: No depression at this time.  No anxiety Endocrine: No palpitations, diaphoresis, change in appetite, change in weigh or increased thirst Hematologic/Lymphatic:  No anemia, purpura, petechiae. Allergic/Immunologic: No itchy/runny eyes, nasal congestion, recent allergic reactions, rashes  ALLERGIES: Allergies  Allergen Reactions   Amoxicillin Hives   Augmentin [Amoxicillin-Pot Clavulanate] Hives   Penicillins Hives   Zithromax [Azithromycin] Hives    HOME MEDICATIONS:  Current  Outpatient Medications:    diphenhydrAMINE (BENADRYL) 25 MG tablet, Take 25 mg by mouth at bedtime as needed for allergies or sleep., Disp: , Rfl:    hydrOXYzine  (ATARAX ) 25 MG tablet, Take 1 tablet (25 mg total) by mouth 3 (three) times daily as needed for anxiety., Disp: 90 tablet, Rfl: 5   ibuprofen (ADVIL) 200 MG tablet, Take 800 mg by mouth every 6 (six) hours as needed for headache or moderate pain., Disp: , Rfl:    KESIMPTA  20 MG/0.4ML SOAJ, Inject 0.4 mLs into the skin every 30 (thirty) days., Disp: 0.4 mL, Rfl: 1   lamoTRIgine  (LAMICTAL ) 100 MG tablet, Take 2 tablets by mouth twice daily, Disp: 120 tablet, Rfl: 0   phentermine  37.5 MG capsule, Take 1 capsule (37.5 mg total) by mouth every morning., Disp: 30 capsule, Rfl: 0   sertraline  (ZOLOFT ) 100 MG tablet, Take 1 tablet (100 mg total) by mouth daily., Disp: 90 tablet, Rfl: 1   Vitamin D, Ergocalciferol, (DRISDOL) 1.25 MG (50000 UNIT) CAPS capsule, Take 50,000 Units by mouth every Monday., Disp: , Rfl:    dalfampridine  10 MG TB12, One po q12 hours (Patient not taking: Reported on 01/24/2024), Disp: 60 tablet, Rfl: 5   imipramine  (TOFRANIL ) 25 MG tablet, Take 2 po qHS, Disp: 60 tablet, Rfl: 11  PAST MEDICAL HISTORY: Past Medical History:   Diagnosis Date   Asthma    Multiple sclerosis (HCC)     PAST SURGICAL HISTORY: Past Surgical History:  Procedure Laterality Date   TONSILLECTOMY  2005    FAMILY HISTORY: Family History  Problem Relation Age of Onset   Thyroid disease Mother    Migraines Mother    Asthma Brother     SOCIAL HISTORY:  Social History   Socioeconomic History   Marital status: Single    Spouse name: Not on file   Number of children: 0   Years of education: Not on file   Highest education level: High school graduate  Occupational History   Not on file  Tobacco Use   Smoking status: Never   Smokeless tobacco: Never  Vaping Use   Vaping status: Never Used  Substance and Sexual Activity   Alcohol use: Never   Drug use: Never   Sexual activity: Not on file  Other Topics Concern   Not on file  Social History Narrative   Lives with mother   R handed   Caffeine: 1 soda a day   Social Drivers of Corporate investment banker Strain: Not on file  Food Insecurity: Not on file  Transportation Needs: Not on file  Physical Activity: Not on file  Stress: Not on file  Social Connections: Not on file  Intimate Partner Violence: Not on file     PHYSICAL EXAM  Vitals:   01/24/24 1105  BP: 114/70  Pulse: (!) 103  SpO2: 99%  Weight: (!) 316 lb 8 oz (143.6 kg)  Height: 5' 5 (1.651 m)     Body mass index is 52.67 kg/m.   General: The patient is well-developed and well-nourished and in no acute distress.   Pharynx is Mallmapati 2.    HEENT:  Head is [place]/AT.  Sclera are anicteric.       Skin: Extremities are without rash or  edema.  Musculoskeletal:  Back is non tender   Neck is tender at left occiput (over splenius capitus muscle  occipital nerve)   Good ROM  Neurologic Exam  Mental status: The patient is alert and  oriented x 3 at the time of the examination. The patient has apparent normal recent and remote memory, with an apparently normal attention span and concentration  ability.   Speech is normal.  Cranial nerves: Extraocular movements are full. Pupils ashow 1+ right APD and she has reduced color vision OD. No dysarthria.      Motor:  Muscle bulk is normal.   Tone is normal. Strength is  5 / 5 in left arm and hand and both legs but 4+/5 intrinsic hand muscles on the right.  Left RAM are mildly reduced in hand.  Sensory: Sensory testing is intact to pinprick, soft touch sensation in all 4 extremities.   She has reduced vibration sensation but preserved position sensation on the right.   Normal sensation in hands  Coordination: Cerebellar testing reveals good finger-nose-finger and heel-to-shin bilaterally.  Gait and station: Station is normal.   Gait is near normal now. Tandem gait is wide. Romberg is negative.   Reflexes: Deep tendon reflexes are symmetric and normal bilaterally.          DIAGNOSTIC DATA (LABS, IMAGING, TESTING) - I reviewed patient records, labs, notes, testing and imaging myself where available.  Lab Results  Component Value Date   WBC 8.4 12/24/2022   HGB 13.1 12/24/2022   HCT 40.0 12/24/2022   MCV 80 12/24/2022   PLT 544 (H) 12/24/2022      Component Value Date/Time   NA 137 12/24/2022 1046   K 4.7 12/24/2022 1046   CL 101 12/24/2022 1046   CO2 22 12/24/2022 1046   GLUCOSE 89 12/24/2022 1046   GLUCOSE 141 (H) 10/13/2021 0154   BUN 10 12/24/2022 1046   CREATININE 0.89 12/24/2022 1046   CALCIUM 9.2 12/24/2022 1046   PROT 7.3 12/24/2022 1046   ALBUMIN 4.0 12/24/2022 1046   AST 14 12/24/2022 1046   ALT 9 12/24/2022 1046   ALKPHOS 140 (H) 12/24/2022 1046   BILITOT 0.3 12/24/2022 1046   GFRNONAA >60 10/13/2021 0154   Lab Results  Component Value Date   CHOL 171 10/11/2021   HDL 45 10/11/2021   LDLCALC 118 (H) 10/11/2021   TRIG 40 10/11/2021   CHOLHDL 3.8 10/11/2021   Lab Results  Component Value Date   HGBA1C 4.7 (L) 10/10/2021   No results found for: VITAMINB12 Lab Results  Component Value Date   TSH  2.722 10/09/2021       ASSESSMENT AND PLAN  Multiple sclerosis (HCC) - Plan: MR BRAIN W WO CONTRAST, MR CERVICAL SPINE W WO CONTRAST, IgG, IgA, IgM, CBC with Differential/Platelet  Gait disturbance - Plan: MR BRAIN W WO CONTRAST, MR CERVICAL SPINE W WO CONTRAST  High risk medication use - Plan: IgG, IgA, IgM, CBC with Differential/Platelet  Numbness - Plan: MR BRAIN W WO CONTRAST, MR CERVICAL SPINE W WO CONTRAST   1.  She will continue Kesimpta  for MS.   Check labs 2.  Left occipital nerve block with 80 mg Depo-merol in Marcaine usiang sterile technique.  She tolerated the procedure well and there were no complication 3.  Stay active and exercise as tolerated. 4.   Continue Lamotrigine  sertraline  for dysesthesias and mood and imipramine  for headache. (Increase to 50 mg).  This will also help insomnia and nocturia 5.  If left arm symptoms worsen, consider NCV/EMG  6.    Phentermine  for weight and ADD symptoms, if not better, consider another stimulant follow-up in 6 months or sooner if there are new or worsening neurologic symptoms.  Noah Pelaez A. Vear, MD, [facility] 01/24/2024, 11:35 AM Certified in Neurology, Clinical Neurophysiology, Sleep Medicine and Neuroimaging  [facility] [address], [address] [place], [place] [address] [phone]

## 2024-01-25 ENCOUNTER — Other Ambulatory Visit: Payer: Self-pay | Admitting: Neurology

## 2024-01-25 ENCOUNTER — Telehealth: Payer: Self-pay | Admitting: Neurology

## 2024-01-25 ENCOUNTER — Ambulatory Visit: Payer: Self-pay | Admitting: Neurology

## 2024-01-25 LAB — CBC WITH DIFFERENTIAL/PLATELET
Basophils Absolute: 0.1 x10E3/uL (ref 0.0–0.2)
Basos: 1 %
EOS (ABSOLUTE): 0.2 x10E3/uL (ref 0.0–0.4)
Eos: 2 %
Hematocrit: 38.3 % (ref 34.0–46.6)
Hemoglobin: 11.9 g/dL (ref 11.1–15.9)
Immature Grans (Abs): 0 x10E3/uL (ref 0.0–0.1)
Immature Granulocytes: 0 %
Lymphocytes Absolute: 1.1 x10E3/uL (ref 0.7–3.1)
Lymphs: 13 %
MCH: 26.1 pg — ABNORMAL LOW (ref 26.6–33.0)
MCHC: 31.1 g/dL — ABNORMAL LOW (ref 31.5–35.7)
MCV: 84 fL (ref 79–97)
Monocytes Absolute: 0.7 x10E3/uL (ref 0.1–0.9)
Monocytes: 9 %
Neutrophils Absolute: 6.1 x10E3/uL (ref 1.4–7.0)
Neutrophils: 74 %
Platelets: 560 x10E3/uL — ABNORMAL HIGH (ref 150–450)
RBC: 4.56 x10E6/uL (ref 3.77–5.28)
RDW: 14.8 % (ref 11.7–15.4)
WBC: 8.2 x10E3/uL (ref 3.4–10.8)

## 2024-01-25 LAB — IGG, IGA, IGM
IgA/Immunoglobulin A, Serum: 265 mg/dL (ref 87–352)
IgG (Immunoglobin G), Serum: 1189 mg/dL (ref 586–1602)
IgM (Immunoglobulin M), Srm: 54 mg/dL (ref 26–217)

## 2024-01-25 NOTE — Telephone Encounter (Signed)
 Last seen on 01/24/24 Follow up scheduled on 08/28/24   Dispensed Days Supply Quantity Provider Pharmacy  PHENTERMINE  37.5MG   CAP 12/24/2023 30 30 each Sater, Charlie LABOR, MD [facility] [phone] ...     Rx pending to be signed

## 2024-01-25 NOTE — Telephone Encounter (Signed)
 Healthy blue shara: [phone] exp. 01/25/24-04/23/24 sent to GI [phone]

## 2024-01-27 NOTE — Telephone Encounter (Signed)
 Called Pharmacy and let them know that the pt is taking the Phentermine  37.5mg  for Weight Loss as well as ADD symptoms.

## 2024-02-22 ENCOUNTER — Other Ambulatory Visit: Payer: Self-pay | Admitting: Neurology

## 2024-02-23 NOTE — Telephone Encounter (Signed)
 Last seen on 01/24/24 Follow up scheduled on 08/28/24

## 2024-02-28 ENCOUNTER — Other Ambulatory Visit: Payer: Self-pay | Admitting: Neurology

## 2024-02-29 ENCOUNTER — Ambulatory Visit
Admission: RE | Admit: 2024-02-29 | Discharge: 2024-02-29 | Disposition: A | Payer: Self-pay | Source: Ambulatory Visit | Attending: Neurology | Admitting: Neurology

## 2024-02-29 ENCOUNTER — Ambulatory Visit
Admission: RE | Admit: 2024-02-29 | Discharge: 2024-02-29 | Disposition: A | Discharge status: Home / Self Care | Payer: Self-pay | Source: Ambulatory Visit | Attending: Neurology | Admitting: Neurology

## 2024-02-29 DIAGNOSIS — R269 Unspecified abnormalities of gait and mobility: Secondary | ICD-10-CM

## 2024-02-29 DIAGNOSIS — G35D Multiple sclerosis, unspecified: Secondary | ICD-10-CM

## 2024-02-29 DIAGNOSIS — R200 Anesthesia of skin: Secondary | ICD-10-CM

## 2024-02-29 DIAGNOSIS — G35A Relapsing-remitting multiple sclerosis: Secondary | ICD-10-CM | POA: Diagnosis not present

## 2024-02-29 MED ORDER — GADOPICLENOL 0.5 MMOL/ML IV SOLN
10.0000 mL | Freq: Once | INTRAVENOUS | Status: AC | PRN
Start: 2024-02-29 — End: 2024-02-29
  Administered 2024-02-29: 10 mL via INTRAVENOUS

## 2024-02-29 NOTE — Telephone Encounter (Signed)
 Last seen on 01/24/24 Follow up scheduled on 08/28/24    Dispensed Days Supply Quantity Provider Pharmacy  PHENTERMINE  37.5MG   CAP 01/27/2024 30 30 each Sater, Charlie LABOR, MD [facility]      Rx pending to be signed

## 2024-03-14 ENCOUNTER — Encounter: Payer: Self-pay | Admitting: *Deleted

## 2024-03-14 NOTE — Telephone Encounter (Signed)
    Faxed to [phone] confirmation received.

## 2024-04-04 ENCOUNTER — Other Ambulatory Visit: Payer: Self-pay

## 2024-04-04 DIAGNOSIS — G35D Multiple sclerosis, unspecified: Secondary | ICD-10-CM

## 2024-04-04 MED ORDER — KESIMPTA 20 MG/0.4ML ~~LOC~~ SOAJ: 0.4000 mL | SUBCUTANEOUS | 5 refills | Status: AC

## 2024-08-28 ENCOUNTER — Ambulatory Visit: Admitting: Neurology
